# Patient Record
Sex: Female | Born: 1950
Health system: Southern US, Community
[De-identification: ages and names within clinical notes are randomized; demographics above are authoritative.]

## PROBLEM LIST (undated history)

## (undated) DIAGNOSIS — E0500 Thyrotoxicosis with diffuse goiter without thyrotoxic crisis or storm: Secondary | ICD-10-CM

## (undated) HISTORY — DX: Thyrotoxicosis with diffuse goiter without thyrotoxic crisis or storm: E05.00

---

## 2000-12-16 ENCOUNTER — Emergency Department (HOSPITAL_COMMUNITY): Admission: EM | Admit: 2000-12-16 | Discharge: 2000-12-16 | Payer: Self-pay | Admitting: *Deleted

## 2002-10-08 ENCOUNTER — Inpatient Hospital Stay (HOSPITAL_COMMUNITY): Admission: EM | Admit: 2002-10-08 | Discharge: 2002-10-14 | Payer: Self-pay

## 2012-09-07 DIAGNOSIS — E0500 Thyrotoxicosis with diffuse goiter without thyrotoxic crisis or storm: Secondary | ICD-10-CM

## 2012-09-07 HISTORY — DX: Thyrotoxicosis with diffuse goiter without thyrotoxic crisis or storm: E05.00

## 2012-09-12 ENCOUNTER — Ambulatory Visit (INDEPENDENT_AMBULATORY_CARE_PROVIDER_SITE_OTHER): Payer: BC Managed Care – PPO | Admitting: Physician Assistant

## 2012-09-12 VITALS — BP 128/80 | HR 74 | Temp 97.9°F | Resp 20 | Ht 59.0 in | Wt 93.0 lb

## 2012-09-12 DIAGNOSIS — E0590 Thyrotoxicosis, unspecified without thyrotoxic crisis or storm: Secondary | ICD-10-CM

## 2012-09-12 DIAGNOSIS — R748 Abnormal levels of other serum enzymes: Secondary | ICD-10-CM

## 2012-09-12 DIAGNOSIS — E876 Hypokalemia: Secondary | ICD-10-CM

## 2012-09-12 NOTE — Progress Notes (Signed)
  Subjective:    Patient ID: Angela Gutierrez, female    DOB: [birth year], 61 y.o.   MRN: [id number]  HPI This is a pleasant 61 yr old vietnamese female presenting with labwork that was done at her job that revealed hyperthyroidism. The patient had the same labwork done 1 year ago.  It also revealed hyperthyroidism.  At that time she didn't seek treatment, today she is.  She is asymptomatic except that her daughter says she has always been thin.  She denies tachycardia, diarrhea, restlessness. She does have trouble sleeping.  Her daughter is with her interpreting.  Abnormal labs as follows:  Nov. 2012  October 2013  Potassium   3.7   3.1 TSH    0.005   0.005 Alk phos   157   171   Review of Systems  All other systems reviewed and are negative.       Objective:   Physical Exam  Nursing note and vitals reviewed. Constitutional: She is oriented to person, place, and time. She appears well-developed.       Very thin  HENT:  Head: Normocephalic and atraumatic.  Right Ear: External ear normal.  Left Ear: External ear normal.  Neck: Normal range of motion. Neck supple. No thyromegaly present.  Cardiovascular: Normal rate, regular rhythm and normal heart sounds.   Pulmonary/Chest: Effort normal and breath sounds normal.  Neurological: She is alert and oriented to person, place, and time.  Skin: Skin is warm and dry.  Psychiatric: She has a normal mood and affect. Her behavior is normal.        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 Since she is not tachycardic, so there is no reason to begin a beta blocker.  I will refer to endocrinology for management.  Checking a thyroid panel tonight.  I will mail them a copy. The patient also has an elevated  alkaline phosphatase I will also make the endocrinologist aware of for evaluation. Asymptomatic hypokalemia (from October 2013 labs). Check BMET today.

## 2012-09-13 LAB — BASIC METABOLIC PANEL
BUN: 14 mg/dL (ref 6–23)
CO2: 24 mEq/L (ref 19–32)
Calcium: 9.3 mg/dL (ref 8.4–10.5)
Chloride: 106 mEq/L (ref 96–112)
Creat: 0.32 mg/dL — ABNORMAL LOW (ref 0.50–1.10)
Glucose, Bld: 90 mg/dL (ref 70–99)
Potassium: 3.7 mEq/L (ref 3.5–5.3)
Sodium: 140 mEq/L (ref 135–145)

## 2012-09-13 LAB — THYROID PANEL WITH TSH
Free Thyroxine Index: 10.8 — ABNORMAL HIGH (ref 1.0–3.9)
T3 Uptake: 47.9 % — ABNORMAL HIGH (ref 22.5–37.0)
T4, Total: 22.6 ug/dL — ABNORMAL HIGH (ref 5.0–12.5)
TSH: <0.008 u[IU]/mL — ABNORMAL LOW (ref 0.350–4.500)

## 2012-09-16 ENCOUNTER — Encounter: Payer: Self-pay | Admitting: *Deleted

## 2012-10-17 ENCOUNTER — Other Ambulatory Visit (HOSPITAL_COMMUNITY): Payer: Self-pay | Admitting: Endocrinology

## 2012-10-17 DIAGNOSIS — E0590 Thyrotoxicosis, unspecified without thyrotoxic crisis or storm: Secondary | ICD-10-CM

## 2012-11-12 ENCOUNTER — Other Ambulatory Visit (HOSPITAL_COMMUNITY): Payer: Self-pay

## 2012-11-12 ENCOUNTER — Encounter (HOSPITAL_COMMUNITY)
Admission: RE | Admit: 2012-11-12 | Discharge: 2012-11-12 | Disposition: A | Discharge status: Home / Self Care | Payer: BC Managed Care – PPO | Source: Ambulatory Visit | Attending: Endocrinology | Admitting: Endocrinology

## 2012-11-12 ENCOUNTER — Ambulatory Visit (HOSPITAL_COMMUNITY): Payer: Self-pay

## 2012-11-12 DIAGNOSIS — E0590 Thyrotoxicosis, unspecified without thyrotoxic crisis or storm: Secondary | ICD-10-CM | POA: Insufficient documentation

## 2012-11-13 ENCOUNTER — Encounter (HOSPITAL_COMMUNITY)
Admission: RE | Admit: 2012-11-13 | Discharge: 2012-11-13 | Disposition: A | Discharge status: Home / Self Care | Payer: BC Managed Care – PPO | Source: Ambulatory Visit | Attending: Endocrinology | Admitting: Endocrinology

## 2012-11-13 ENCOUNTER — Other Ambulatory Visit (HOSPITAL_COMMUNITY): Payer: Self-pay

## 2012-11-13 MED ORDER — SODIUM PERTECHNETATE TC 99M INJECTION
9.7000 | Freq: Once | INTRAVENOUS | Status: AC | PRN
  Administered 2012-11-13: 10 via INTRAVENOUS

## 2012-11-13 MED ORDER — SODIUM IODIDE I 131 CAPSULE
7.0000 | Freq: Once | INTRAVENOUS | Status: AC | PRN
  Administered 2012-11-13: 7 via ORAL

## 2012-11-23 ENCOUNTER — Ambulatory Visit (INDEPENDENT_AMBULATORY_CARE_PROVIDER_SITE_OTHER): Payer: BC Managed Care – PPO | Admitting: Family Medicine

## 2012-11-23 ENCOUNTER — Ambulatory Visit: Payer: BC Managed Care – PPO

## 2012-11-23 VITALS — BP 156/77 | HR 107 | Temp 98.3°F | Resp 16 | Ht 59.0 in | Wt 93.0 lb

## 2012-11-23 DIAGNOSIS — M25539 Pain in unspecified wrist: Secondary | ICD-10-CM

## 2012-11-23 DIAGNOSIS — S63509A Unspecified sprain of unspecified wrist, initial encounter: Secondary | ICD-10-CM

## 2012-11-23 DIAGNOSIS — M25531 Pain in right wrist: Secondary | ICD-10-CM

## 2012-11-23 DIAGNOSIS — S66911A Strain of unspecified muscle, fascia and tendon at wrist and hand level, right hand, initial encounter: Secondary | ICD-10-CM

## 2012-11-23 NOTE — Progress Notes (Signed)
[address]   [place], [place]  [address]   [phone]  Subjective:    Patient ID: Angela Gutierrez, female    DOB: 1950/12/05, 62 y.o.   MRN: [id number]  HPI This 62 y.o. female presents for evaluation of R wrist pain. Slipped on ice last night at 8:00pm.  Slipped on ice and landed on outstretched hand R.  Now suffering with pain radial aspect of wrist.  +swelling.  No numbness or tingling; only suffering with pain. +icing.  No medication.  Pain kept up last night.  Works with machinery at work; Field seismologist a lot at work.  PCP:  none   Review of Systems  Constitutional: Negative for fever, chills, diaphoresis and fatigue.  Musculoskeletal: Positive for myalgias, joint swelling and arthralgias.  Neurological: Positive for weakness. Negative for numbness.        Past Medical History  Diagnosis Date  . Graves disease 09/2012    History reviewed. No pertinent past surgical history.  Prior to Admission medications   Medication Sig Start Date End Date Taking? Authorizing Provider  potassium bicarbonate (K-LYTE) 25 MEQ disintegrating tablet Take 25 mEq by mouth 2 (two) times daily.   Yes Historical Provider, MD    No Known Allergies  History   Social History  . Marital Status: Married    Spouse Name: N/A    Number of Children: N/A  . Years of Education: N/A   Occupational History  . Machine Software engineer  And  [employer]   Social History Main Topics  . Smoking status: Never Smoker   . Smokeless tobacco: Not on file  . Alcohol Use: No  . Drug Use: No  . Sexually Active: Not on file   Other Topics Concern  . Not on file   Social History Narrative  . No narrative on file    Family History  Problem Relation Age of Onset  . Hypertension Mother   . Cancer Father     Objective:   Physical Exam  Nursing note and vitals reviewed. Constitutional: She appears well-developed and well-nourished. No distress.  Cardiovascular: Intact distal pulses.   Pulses:  Radial pulses are 2+ on the right side.  Capillary refill < 3 seconds R hand.  Musculoskeletal:       Right elbow: Normal.She exhibits normal range of motion, no swelling and no effusion. No tenderness found. No radial head, no medial epicondyle, no lateral epicondyle and no olecranon process tenderness noted.       Right wrist: She exhibits swelling. She exhibits normal range of motion, no tenderness and no bony tenderness.       Right hand: She exhibits decreased range of motion, tenderness, bony tenderness and swelling. She exhibits normal two-point discrimination, normal capillary refill, no deformity and no laceration. Normal sensation noted. Decreased sensation is not present in the ulnar distribution, is not present in the medial distribution and is not present in the radial distribution. Normal strength noted.  R FOREARM:  FULL EXTENSION R ELBOW; FULL ROM R WRIST WITH MILD PAIN PROXIMAL HAND; +SWELLING PROXIMAL HAND; +TTP SNUFFBOX; FULL ROM THUMB R WITHOUT PAIN OR LIMITATION.  GRIP 5/5.  NORMAL PRONATION/SUPINATION RUE.  Skin: She is not diaphoretic.    [facility] reading (PRIMARY) by  Dr. Katrinka Blazing.    R WRIST: NAD; R HAND: NAD.      Assessment & Plan:  Pain in right wrist - Plan: DG Wrist Complete Right, DG Hand Complete Right    1. Pain R  hand/wrist:  New.  After fall on ice.  +snuffbox tenderness. Treat conservatively with thumb spica splint, icing bid for next 5 days.  No use of R hand for next week at work; work note provided. 2.  Strain R hand/wrist:  New.  Thumb spica splint provided to wear until next visit in one week.  Ice bid for next five days.  RTC in one week for reevaluation.

## 2012-11-23 NOTE — Patient Instructions (Addendum)
Pain in right wrist - Plan: DG Wrist Complete Right, DG Hand Complete Right  Strain of wrist, right, initial encounter    WEAR SPLINT DAILY AND AT NIGHT FOR NEXT WEEK. ICE WRIST TWICE DAILY FOR NEXT FIVE DAYS. RETURN IN ONE WEEK FOR REEVALUATION.

## 2012-11-30 ENCOUNTER — Ambulatory Visit (INDEPENDENT_AMBULATORY_CARE_PROVIDER_SITE_OTHER): Payer: BC Managed Care – PPO | Admitting: Family Medicine

## 2012-11-30 VITALS — BP 111/63 | HR 77 | Temp 97.8°F | Resp 16 | Ht 60.0 in | Wt 93.2 lb

## 2012-11-30 DIAGNOSIS — S63501D Unspecified sprain of right wrist, subsequent encounter: Secondary | ICD-10-CM

## 2012-11-30 NOTE — Patient Instructions (Signed)
Continue the splint for protection for 1 more week, and then recheck.  If the scaphoid bone is still sore - we will recheck an x ray.  See note for work.  Return to the clinic or go to the nearest emergency room if any of your symptoms worsen or new symptoms occur.

## 2012-11-30 NOTE — Addendum Note (Signed)
Addended by: Franciso Bend on: 11/30/2012 10:44 AM   Modules accepted: Orders

## 2012-11-30 NOTE — Progress Notes (Signed)
  Subjective:    Patient ID: Angela Gutierrez, female    DOB: 08/29/1951, 62 y.o.   MRN: [id number]  HPI Angela ACASIA SKILTON is a 62 y.o. female S/p fall on R wrist 2/115/14.  See 11/23/12 ov with Dr. Katrinka Blazing.  Placed into thumb spica splint and here for follow up. xray reports reviewed of R  Hand and wrist - no acute bony abnormality.   Here with dtr- translating.   Pain better everyday. Naproxen - up to 4 times per day.  Work - makes Engineering geologist. R hand dominant.    Review of Systems No rashes. Skin intact, no additional areas of pain - no elbow/shoulder pain.     Objective:   Physical Exam  Vitals reviewed. Constitutional: She is oriented to person, place, and time. She appears well-developed and well-nourished. No distress.  Pulmonary/Chest: Effort normal.  Musculoskeletal:       Right wrist: She exhibits bony tenderness (slight ttp at scaphoid only, no distal or proximal radial ttp, no other ttp on wrist/hand.  skin intact. ). She exhibits normal range of motion, no swelling, no effusion, no crepitus, no deformity and no laceration.  Neurological: She is alert and oriented to person, place, and time.  nvi distally.   Skin: Skin is warm and dry.  Psychiatric: She has a normal mood and affect. Her behavior is normal.      Assessment & Plan:  Angela Gutierrez is a 62 y.o. female Wrist sprain, right, subsequent encounter  Pain, wrist joint, right  Still ttp at scaphoid - continue brace for 1 more week, then recheck xray. Naprosyn only as needed.  rtc precautions.  Patient Instructions  Continue the splint for protection for 1 more week, and then recheck.  If the scaphoid bone is still sore - we will recheck an x ray.  See note for work.  Return to the clinic or go to the nearest emergency room if any of your symptoms worsen or new symptoms occur.

## 2012-12-01 ENCOUNTER — Other Ambulatory Visit (HOSPITAL_COMMUNITY): Payer: Self-pay | Admitting: Endocrinology

## 2012-12-01 DIAGNOSIS — E0590 Thyrotoxicosis, unspecified without thyrotoxic crisis or storm: Secondary | ICD-10-CM

## 2012-12-04 ENCOUNTER — Ambulatory Visit (HOSPITAL_COMMUNITY)
Admission: RE | Admit: 2012-12-04 | Discharge: 2012-12-04 | Disposition: A | Discharge status: Home / Self Care | Payer: BC Managed Care – PPO | Source: Ambulatory Visit | Attending: Endocrinology | Admitting: Endocrinology

## 2012-12-04 ENCOUNTER — Ambulatory Visit (INDEPENDENT_AMBULATORY_CARE_PROVIDER_SITE_OTHER): Payer: BC Managed Care – PPO | Admitting: Family Medicine

## 2012-12-04 ENCOUNTER — Ambulatory Visit: Payer: BC Managed Care – PPO

## 2012-12-04 VITALS — BP 122/66 | HR 68 | Temp 97.9°F | Resp 12 | Ht 60.0 in | Wt 92.8 lb

## 2012-12-04 DIAGNOSIS — S63501D Unspecified sprain of right wrist, subsequent encounter: Secondary | ICD-10-CM

## 2012-12-04 DIAGNOSIS — E0590 Thyrotoxicosis, unspecified without thyrotoxic crisis or storm: Secondary | ICD-10-CM | POA: Insufficient documentation

## 2012-12-04 NOTE — Progress Notes (Signed)
  Subjective:    Patient ID: Angela Gutierrez, female    DOB: Feb 07, 1951, 62 y.o.   MRN: [id number]  HPI Angela Gutierrez is a 62 y.o. female S/p fall 11/22/12 - see initial ov 11/23/12 with Dr. Katrinka Blazing, and my recent eval.  Still ttp at scaphoid last ov - continued thumb spica splint.  Here for recheck.   Feels better - wearing brace.   No pain meds needed for this.  Swelling resolved.  Little soreness yesterday - no pain today.   Review of Systems As above, no bruising, swelling resolved.     Objective:   Physical Exam  Vitals reviewed. Constitutional: She is oriented to person, place, and time.  Pulmonary/Chest: Effort normal.  Musculoskeletal:       Right wrist: She exhibits normal range of motion.       Arms: Neurological: She is alert and oriented to person, place, and time.  Skin: Skin is warm and dry. No rash noted. No erythema.  Psychiatric: She has a normal mood and affect. Her behavior is normal.   [facility] reading (PRIMARY) by  Dr. Neva Seat:  R wrist: questionable irregularity at distal 1/3rd scaphoid on scaphoid view only.        Assessment & Plan:  Angela Gutierrez is a 62 y.o. female Wrist sprain, right, subsequent encounter - Plan: DG Wrist Complete Right  Wrist sprain - symptomatically improved. No discrete fx on xray, only min ttp with deep palpation, but no pain with rom/use. Will keep in thumb spica splint until overread and if negative, consider trial of out of splint starting next week.   rtc precautions, and dtr here interpreting.   Patient Instructions  Continue the splint until the radiologist reads the xray, but if normal/negative - can try out of splint next week, then recheck if any recurrence of pain.

## 2012-12-04 NOTE — Patient Instructions (Addendum)
Continue the splint until the radiologist reads the xray, but if normal/negative - can try out of splint next week, then recheck if any recurrence of pain.

## 2012-12-05 ENCOUNTER — Telehealth: Payer: Self-pay

## 2012-12-05 NOTE — Telephone Encounter (Signed)
Patients daughter advised and she is provided your schedule.

## 2012-12-05 NOTE — Telephone Encounter (Signed)
ANH WOULD LIKE TO SPEAK WITH SOMEONE REGARDING HER MOM'S XRAYS. STATES DR Neva Seat WANTED HER TO COME BACK IN A WEEK, BUT SHE HAVE A FRACTURE SO SHE DIDN'T KNOW IF HER MOM SHOULD COME BACK OR SHOULD WE REFER HER. PLEASE CALL AFTER 12:30 TO [phone]

## 2012-12-05 NOTE — Telephone Encounter (Signed)
Notes Recorded by Levon Hedger on 12/05/2012 at 9:12 AM SPOKE WITH PT'S DAUGHTER AND GAVE HER MESSAGE. ------  Notes Recorded by Shade Flood, MD on 12/04/2012 at 4:06 PM Call patient/daughter. Radiologist did also see the questionable area of fracture on the scaphoid bone - but only on the one view. Due to this, I would recommend continuing the brace for another week then recheck. If her work will let her work with the brace on, she can return, but if not, we can give her a note for work.  Called left message for call back.

## 2012-12-14 ENCOUNTER — Ambulatory Visit (INDEPENDENT_AMBULATORY_CARE_PROVIDER_SITE_OTHER): Payer: BC Managed Care – PPO | Admitting: Family Medicine

## 2012-12-14 ENCOUNTER — Ambulatory Visit: Payer: BC Managed Care – PPO

## 2012-12-14 VITALS — BP 106/60 | HR 61 | Temp 97.7°F | Resp 16 | Ht 60.25 in | Wt 91.2 lb

## 2012-12-14 NOTE — Progress Notes (Signed)
  Subjective:    Patient ID: Angela Gutierrez, female    DOB: Feb 12, 1951, 62 y.o.   MRN: [id number]  HPI Angela Gutierrez is a 62 y.o. female  S/p fall 11/22/12 - see prior notes.  Placed in thumb spica splint for possible scaphoid fx. Last ov 12/04/12, much improved, pain free rom, only minimal soreness with deep palpation of scaphoid.  xr report: Findings: Joint spaces appear intact. Dedicated scaphoid view does  demonstrate lucency involving the distal third of the scaphoid  which may simply represent normal trabeculation, but nondisplaced  fracture cannot be excluded. No other abnormality is noted.  IMPRESSION:  Linear lucency involving the distal scaphoid seen on scaphoid view  only which may represent normal trabeculation, but nondisplaced  fracture cannot be excluded. No other abnormality seen.   Sore in base of thumb - past few days, no new injury.  Slight ache - feels better when wearing brace.   Radiation for hyperthyroid  About 10 days ago.   Review of Systems No rash, no new injury.      Objective:   Physical Exam  Vitals reviewed. Constitutional: She is oriented to person, place, and time. She appears well-developed and well-nourished. No distress.  Pulmonary/Chest: Effort normal.  Musculoskeletal:       Right wrist: She exhibits decreased range of motion, tenderness and bony tenderness (not tender at scaphoid, but slight ttp at 1st [facility] joint with slight sts.  no redness/wound. ).  Neurological: She is alert and oriented to person, place, and time.  nvi distally.   Skin: Skin is warm and dry.  Psychiatric: She has a normal mood and affect. Her behavior is normal.    [facility] reading (PRIMARY) by  Dr. Neva Seat: R wrist with scaphoid views.  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 .     Assessment & Plan:  Angela Gutierrez is a 62 y.o. female Right wrist pain - Plan: DG Wrist Complete Right, AMB referral to orthopedics  Wrist strain, right, subsequent encounter - Plan: AMB referral  to orthopedics  suspected initialwrist sprain 11/22/12, doubt scaphoid fx as nt, but questionable area on XR and for this reason has remained in thumb spica splint. 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  Will send xr to overread again, but plan on ortho eval this week. Brace until seen. But can apply heat or ice, tylenol prn.  rtc precautions.   Patient Instructions  Continue brace until report received from radiologist.  Heat or ice to area as needed. We will call you wit the xray report, but will plan on evaluation this week with an orthopaedic doctor.  Return to the clinic or go to the nearest emergency room if any of your symptoms worsen or new symptoms occur.

## 2012-12-14 NOTE — Patient Instructions (Signed)
Continue brace until report received from radiologist.  Heat or ice to area as needed. We will call you wit the xray report, but will plan on evaluation this week with an orthopaedic doctor.  Return to the clinic or go to the nearest emergency room if any of your symptoms worsen or new symptoms occur.

## 2012-12-15 ENCOUNTER — Telehealth: Payer: Self-pay

## 2012-12-15 NOTE — Telephone Encounter (Signed)
Pt daughter is calling back because someone called her mother today and her mother had came in on Sunday to be seen and Dr. Neva Seat seen the mother she is wondering what the call is about.

## 2012-12-16 NOTE — Telephone Encounter (Signed)
Spoke with daughter Carvel Getting about xray results

## 2013-05-20 ENCOUNTER — Encounter: Payer: Self-pay | Admitting: Family Medicine

## 2014-01-31 IMAGING — CR DG WRIST COMPLETE 3+V*R*
2 series · 2 of 2 positions shown · non-contrast
Comparison: None

CLINICAL DATA: Fall on ice.  Wrist pain.

RIGHT WRIST - COMPLETE 3+ VIEW

[PA]
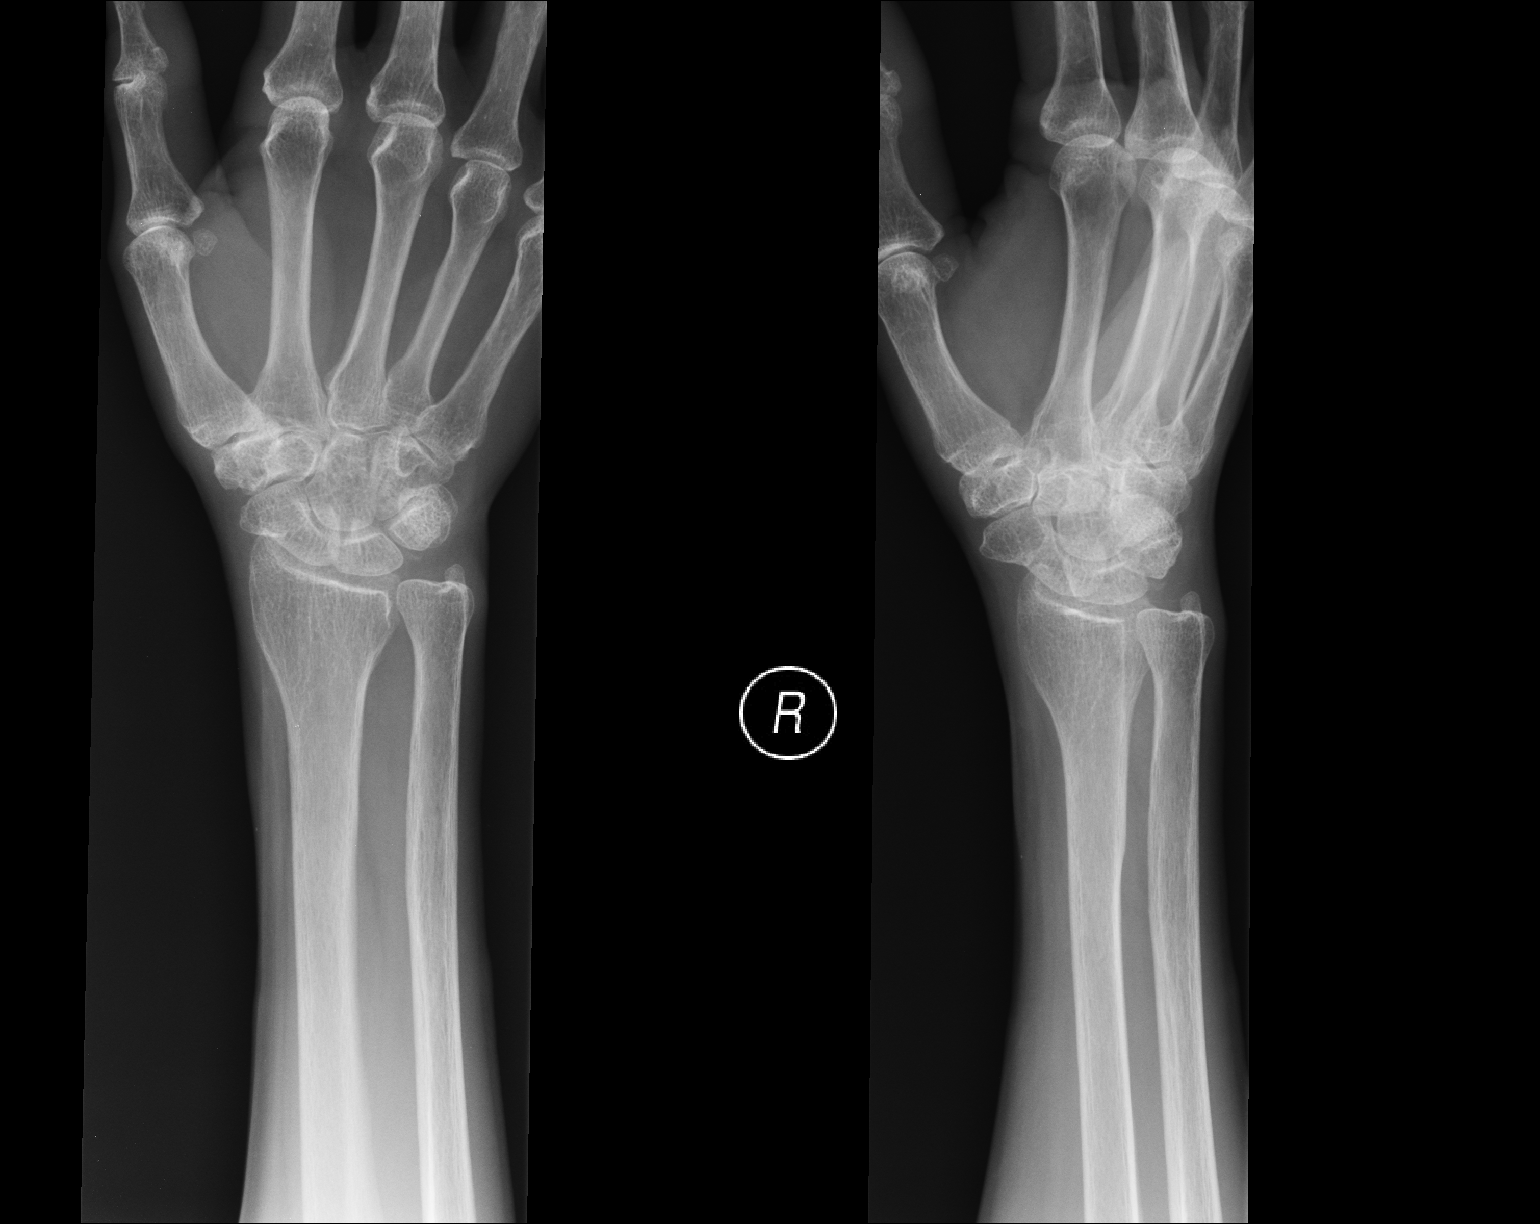

[lateral]
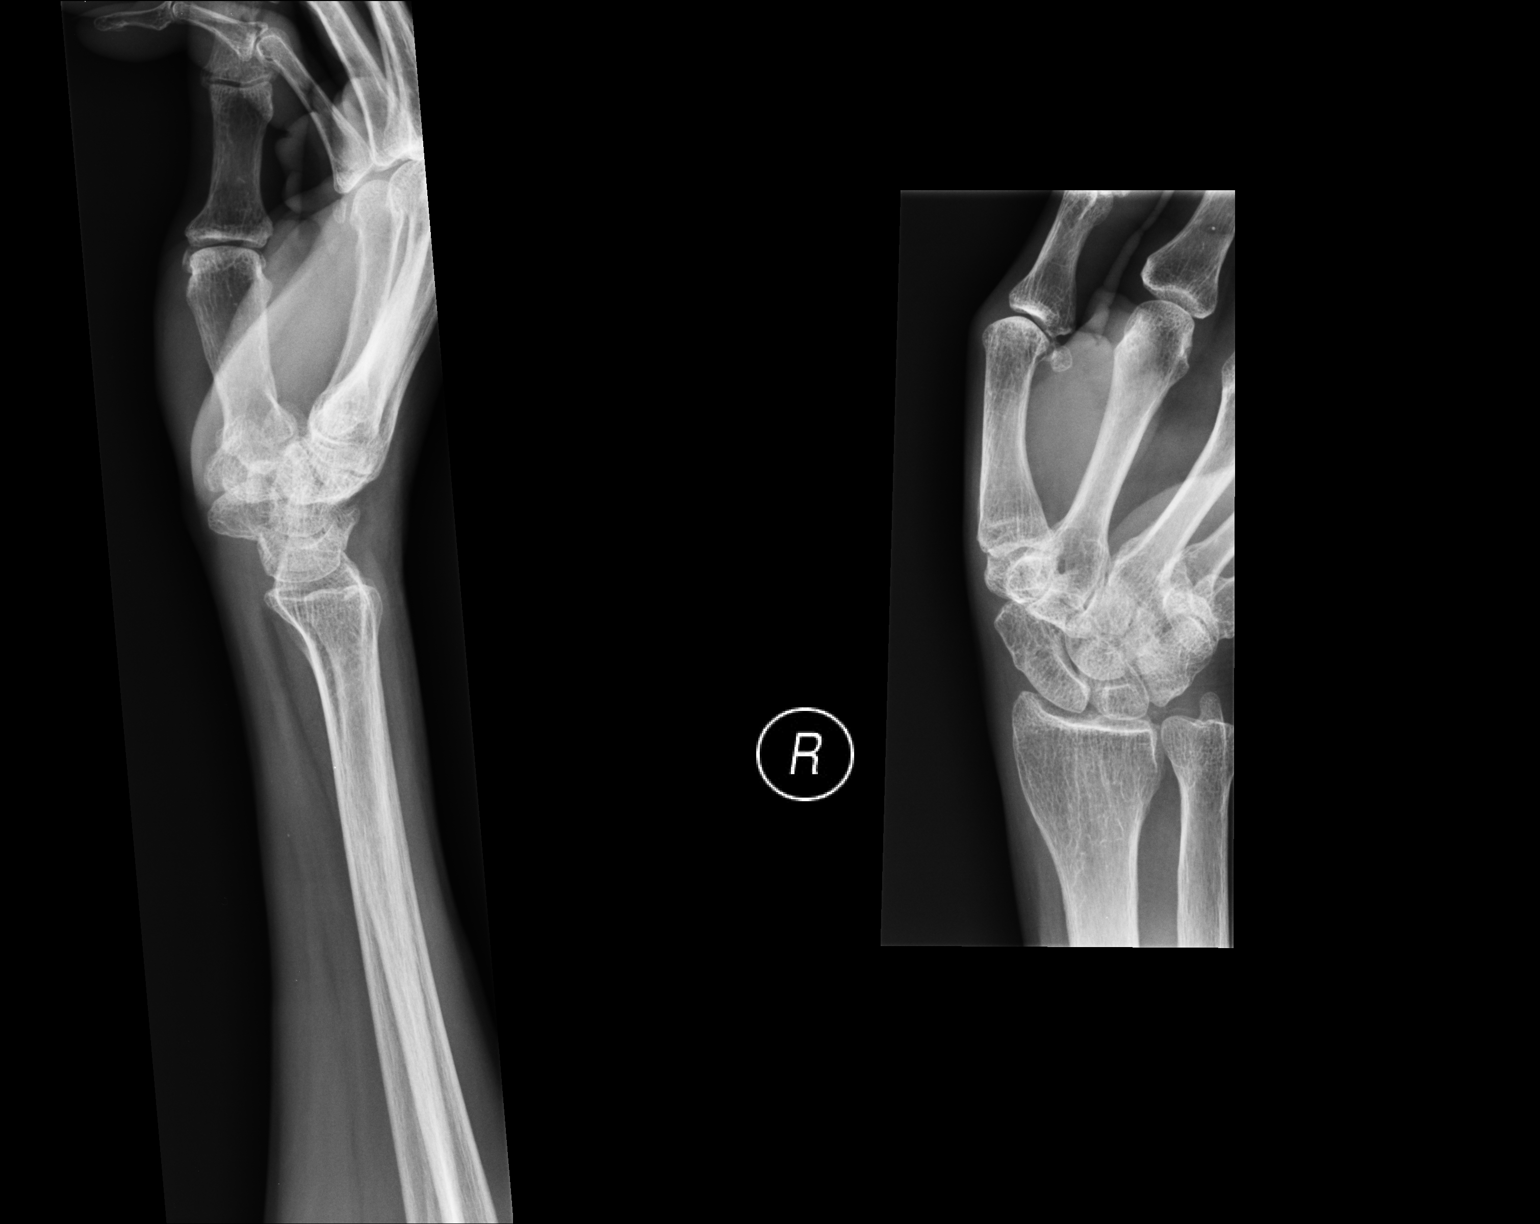

[2 of 2 positions shown; findings below may reference images not displayed]

FINDINGS: No acute bony abnormality.  Specifically, no fracture,
subluxation, or dislocation.  Soft tissues are intact.  Wrist
joints are maintained.
IMPRESSION: No acute bony abnormality.

## 2015-10-03 ENCOUNTER — Ambulatory Visit (INDEPENDENT_AMBULATORY_CARE_PROVIDER_SITE_OTHER): Payer: BLUE CROSS/BLUE SHIELD | Admitting: Family Medicine

## 2015-10-03 VITALS — BP 138/80 | HR 108 | Temp 98.2°F | Resp 20 | Ht 59.0 in | Wt 107.4 lb

## 2015-10-03 DIAGNOSIS — W540XXA Bitten by dog, initial encounter: Secondary | ICD-10-CM | POA: Diagnosis not present

## 2015-10-03 DIAGNOSIS — M79601 Pain in right arm: Secondary | ICD-10-CM | POA: Diagnosis not present

## 2015-10-03 DIAGNOSIS — M79604 Pain in right leg: Secondary | ICD-10-CM

## 2015-10-03 DIAGNOSIS — S51811A Laceration without foreign body of right forearm, initial encounter: Secondary | ICD-10-CM | POA: Diagnosis not present

## 2015-10-03 DIAGNOSIS — Z23 Encounter for immunization: Secondary | ICD-10-CM | POA: Diagnosis not present

## 2015-10-03 MED ORDER — AMOXICILLIN-POT CLAVULANATE 500-125 MG PO TABS: 1.0000 | ORAL_TABLET | Freq: Two times a day (BID) | ORAL | Status: DC

## 2015-10-03 NOTE — Progress Notes (Signed)
Procedure: Verbal consent obtained. Skin was anesthetized with 3 cc 1% lido with epi and cleaned with soap and water. Lacerations located right forearm was sutured with #5 5.0 ethilon sutures. Wound was dressed and wound care discussed.

## 2015-10-03 NOTE — Progress Notes (Signed)
 @  By signing my name below, I, Raven Small, attest that this documentation has been prepared under the direction and in the presence of Elvina Sidle, MD.  Electronically Signed: Andrew Au, ED Scribe. 10/03/2015. 7:24 PM.  Patient ID: Angela Gutierrez MRN: [id number], DOB: 07-28-1951, 64 y.o. Date of Encounter: 10/03/2015, 7:21 PM  Primary Physician: Michiel Sites, MD  Chief Complaint:  Chief Complaint  Patient presents with   Animal Bite    dog, today    HPI: 64 y.o. year old female with history below presents with a dog bite to right wrist that occurred 20 minutes ago. Pt was bit by her daughters small dog, after trying to pick it up. She now had multiple lacerations to right wrist. The wounds were cleaned with alcohol and cover with a band aid. The dog is UTD on immunization.     Past Medical History  Diagnosis Date   Graves disease 09/2012     Home Meds: Prior to Admission medications   Medication Sig Start Date End Date Taking? Authorizing Provider  Cholecalciferol (VITAMIN D3 PO) Take 10,000 Units by mouth See admin instructions. Twice week , Tuesday and Thurday   Yes Historical Provider, MD  levothyroxine (SYNTHROID, LEVOTHROID) 75 MCG tablet Take 75 mcg by mouth daily before breakfast.   Yes Historical Provider, MD    Allergies: No Known Allergies  Social History   Social History   Marital Status: Married    Spouse Name: N/A   Number of Children: N/A   Years of Education: N/A   Occupational History   International aid/development worker  And  [employer]   Social History Main Topics   Smoking status: Never Smoker    Smokeless tobacco: Not on file   Alcohol Use: No   Drug Use: No   Sexual Activity: Not on file   Other Topics Concern   Not on file   Social History Narrative     Review of Systems: Constitutional: negative for chills, fever, night sweats, weight changes, or fatigue  HEENT: negative for vision changes, hearing loss,  congestion, rhinorrhea, ST, epistaxis, or sinus pressure Cardiovascular: negative for chest pain or palpitations Respiratory: negative for hemoptysis, wheezing, shortness of breath, or cough Abdominal: negative for abdominal pain, nausea, vomiting, diarrhea, or constipation Dermatological: negative for rash. Positive wounds Neurologic: negative for headache, dizziness, or syncope All other systems reviewed and are otherwise negative with the exception to those above and in the HPI.   Physical Exam: Blood pressure 138/80, pulse 108, temperature 98.2 F (36.8 C), temperature source Oral, resp. rate 20, height  (1.499 m), weight 107 lb 6.4 oz (48.716 kg), SpO2 99 %., Body mass index is 21.68 kg/(m^2). General: Well developed, well nourished, in no acute distress. Head: Normocephalic, atraumatic, eyes without discharge, sclera non-icteric, nares are without discharge. Bilateral auditory canals clear, TM's are without perforation, pearly grey and translucent with reflective cone of light bilaterally. Oral cavity moist, posterior pharynx without exudate, erythema, peritonsillar abscess, or post nasal drip.  Neck: Supple. No thyromegaly. Full ROM. No lymphadenopathy. Msk:  Strength and tone normal for age. Extremities/Skin: Warm and dry. Multiple 3-4 mm lacerations over the ulna of the right forearm distally, 1.5 cm laceration over the right ulna with jagged edges Neuro: Alert and oriented X 3. Moves all extremities spontaneously. Gait is normal. CNII-XII grossly in tact. Psych:  Responds to questions appropriately with a normal affect.    ASSESSMENT AND PLAN:  64 y.o. year old female with  dog bite one hour prior to arrival This chart was scribed in my presence and reviewed by me personally.    ICD-9-CM ICD-10-CM   1. Dog bite 879.8 T14.8 Tdap vaccine greater than or equal to 7yo IM   E906.0 W54.0XXA   2. Arm pain, medial, right 729.5 M79.601     M79.604       Signed, Elvina SidleKurt  Lauenstein, MD 10/03/2015 7:21 PM

## 2015-10-03 NOTE — Patient Instructions (Addendum)
WOUND CARE Please return in 7 days to have your stitches/staples removed or sooner if you have concerns. Marland Kitchen Keep area clean and dry with dressing in place for 24 hours. . After 24 hours, remove bandage and wash wound gently with mild soap and warm water. When skin is dry, reapply a new bandage. Once wound is no longer draining, may leave wound open to air. . Continue daily cleansing with soap and water until stitches/staples are removed. . Do not apply any ointments or creams to the wound while stitches/staples are in place, as this may cause delayed healing. . Notify the office if you experience any of the following signs of infection: Swelling, redness, pus drainage, streaking, fever >101.0 F . Notify the office if you experience excessive bleeding that does not stop after 15-20 minutes of constant, firm Pressure.  Take antibiotic twice a day until finished.    Tdap Vaccine (Tetanus, Diphtheria and Pertussis): What You Need to Know 1. Why get vaccinated? Tetanus, diphtheria and pertussis are very serious diseases. Tdap vaccine can protect [place] from these diseases. And, Tdap vaccine given to pregnant women can protect newborn babies against pertussis. TETANUS (Lockjaw) is rare in the [place] today. It causes painful muscle tightening and stiffness, usually all over the body.  It can lead to tightening of muscles in the head and neck so you can't open your mouth, swallow, or sometimes even breathe. Tetanus kills about 1 out of 10 people who are infected even after receiving the best medical care. DIPHTHERIA is also rare in the [place] today. It can cause a thick coating to form in the back of the throat.  It can lead to breathing problems, heart failure, paralysis, and death. PERTUSSIS (Whooping Cough) causes severe coughing spells, which can cause difficulty breathing, vomiting and disturbed sleep.  It can also lead to weight loss, incontinence, and rib fractures. Up to 2  in 100 adolescents and 5 in 100 adults with pertussis are hospitalized or have complications, which could include pneumonia or death. These diseases are caused by bacteria. Diphtheria and pertussis are spread from person to person through secretions from coughing or sneezing. Tetanus enters the body through cuts, scratches, or wounds. Before vaccines, as many as 200,000 cases of diphtheria, 200,000 cases of pertussis, and hundreds of cases of tetanus, were reported in the [place] each year. Since vaccination began, reports of cases for tetanus and diphtheria have dropped by about 99% and for pertussis by about 80%. 2. Tdap vaccine Tdap vaccine can protect adolescents and adults from tetanus, diphtheria, and pertussis. One dose of Tdap is routinely given at age 33 or 9. People who did not get Tdap at that age should get it as soon as possible. Tdap is especially important for healthcare professionals and anyone having close contact with a baby younger than 12 months. Pregnant women should get a dose of Tdap during every pregnancy, to protect the newborn from pertussis. Infants are most at risk for severe, life-threatening complications from pertussis. Another vaccine, called Td, protects against tetanus and diphtheria, but not pertussis. A Td booster should be given every 10 years. Tdap may be given as one of these boosters if you have never gotten Tdap before. Tdap may also be given after a severe cut or burn to prevent tetanus infection. Your doctor or the person giving you the vaccine can give you more information. Tdap may safely be given at the same time as other vaccines. 3. Some people should not  get this vaccine  A person who has ever had a life-threatening allergic reaction after a previous dose of any diphtheria, tetanus or pertussis containing vaccine, OR has a severe allergy to any part of this vaccine, should not get Tdap vaccine. Tell the person giving the vaccine about any severe  allergies.  Anyone who had coma or long repeated seizures within 7 days after a childhood dose of DTP or DTaP, or a previous dose of Tdap, should not get Tdap, unless a cause other than the vaccine was found. They can still get Td.  Talk to your doctor if you:  have seizures or another nervous system problem,  had severe pain or swelling after any vaccine containing diphtheria, tetanus or pertussis,  ever had a condition called Guillain-Barr Syndrome (GBS),  aren't feeling well on the day the shot is scheduled. 4. Risks With any medicine, including vaccines, there is a chance of side effects. These are usually mild and go away on their own. Serious reactions are also possible but are rare. Most people who get Tdap vaccine do not have any problems with it. Mild problems following Tdap (Did not interfere with activities)  Pain where the shot was given (about 3 in 4 adolescents or 2 in 3 adults)  Redness or swelling where the shot was given (about 1 person in 5)  Mild fever of at least 100.48F (up to about 1 in 25 adolescents or 1 in 100 adults)  Headache (about 3 or 4 people in 10)  Tiredness (about 1 person in 3 or 4)  Nausea, vomiting, diarrhea, stomach ache (up to 1 in 4 adolescents or 1 in 10 adults)  Chills, sore joints (about 1 person in 10)  Body aches (about 1 person in 3 or 4)  Rash, swollen glands (uncommon) Moderate problems following Tdap (Interfered with activities, but did not require medical attention)  Pain where the shot was given (up to 1 in 5 or 6)  Redness or swelling where the shot was given (up to about 1 in 16 adolescents or 1 in 12 adults)  Fever over 102F (about 1 in 100 adolescents or 1 in 250 adults)  Headache (about 1 in 7 adolescents or 1 in 10 adults)  Nausea, vomiting, diarrhea, stomach ache (up to 1 or 3 people in 100)  Swelling of the entire arm where the shot was given (up to about 1 in 500). Severe problems following Tdap (Unable  to perform usual activities; required medical attention)  Swelling, severe pain, bleeding and redness in the arm where the shot was given (rare). Problems that could happen after any vaccine:  People sometimes faint after a medical procedure, including vaccination. Sitting or lying down for about 15 minutes can help prevent fainting, and injuries caused by a fall. Tell your doctor if you feel dizzy, or have vision changes or ringing in the ears.  Some people get severe pain in the shoulder and have difficulty moving the arm where a shot was given. This happens very rarely.  Any medication can cause a severe allergic reaction. Such reactions from a vaccine are very rare, estimated at fewer than 1 in a million doses, and would happen within a few minutes to a few hours after the vaccination. As with any medicine, there is a very remote chance of a vaccine causing a serious injury or death. The safety of vaccines is always being monitored. For more information, visit: http://floyd.org/www.cdc.gov/vaccinesafety/ 5. What if there is a serious problem? What should  I look for?  Look for anything that concerns you, such as signs of a severe allergic reaction, very high fever, or unusual behavior.  Signs of a severe allergic reaction can include hives, swelling of the face and throat, difficulty breathing, a fast heartbeat, dizziness, and weakness. These would usually start a few minutes to a few hours after the vaccination. What should I do?  If you think it is a severe allergic reaction or other emergency that can't wait, call 9-1-1 or get the person to the nearest hospital. Otherwise, call your doctor.  Afterward, the reaction should be reported to the Vaccine Adverse Event Reporting System (VAERS). Your doctor might file this report, or you can do it yourself through the VAERS web site at www.vaers.LAgents.no, or by calling 1-402 294 4337. VAERS does not give medical advice.  6. The National Vaccine Injury  Compensation Program The [employer] Vaccine Injury Compensation Program (VICP) is a federal program that was created to compensate people who may have been injured by certain vaccines. Persons who believe they may have been injured by a vaccine can learn about the program and about filing a claim by calling 1-347-415-4613 or visiting the VICP website at SpiritualWord.at. There is a time limit to file a claim for compensation. 7. How can I learn more?  Ask your doctor. He or she can give you the vaccine package insert or suggest other sources of information.  Call your local or state health department.  Contact the Centers for Disease Control and Prevention (CDC):  Call (709)574-7212 (1-800-CDC-INFO) or  Visit CDC's website at PicCapture.uy CDC Tdap Vaccine VIS (12/01/13)   This information is not intended to replace advice given to you by your health care provider. Make sure you discuss any questions you have with your health care provider.   Document Released: 03/25/2012 Document Revised: 10/15/2014 Document Reviewed: 01/06/2014 Elsevier Interactive Patient Education [employer]

## 2015-10-10 ENCOUNTER — Encounter: Payer: Self-pay | Admitting: Physician Assistant

## 2015-10-10 ENCOUNTER — Ambulatory Visit (INDEPENDENT_AMBULATORY_CARE_PROVIDER_SITE_OTHER): Payer: BLUE CROSS/BLUE SHIELD | Admitting: Physician Assistant

## 2015-10-10 VITALS — BP 140/70 | HR 92 | Temp 98.2°F | Resp 17 | Ht 59.0 in | Wt 112.0 lb

## 2015-10-10 DIAGNOSIS — Z5189 Encounter for other specified aftercare: Secondary | ICD-10-CM

## 2015-10-10 NOTE — Progress Notes (Signed)
   10/10/2015 6:14 PM   DOB: [birth year] / MRN: [id number]  SUBJECTIVE:  Ms. Angela Gutierrez is a well appearing 65 y.o. here today for wound care. She denies exquisite tenderness at the site of the wound, nausea, emesis, fever and chills.  She has been compliant with medical therapy and recommendations thus far.   She has No Known Allergies.   She  has a past medical history of Graves disease (09/2012).    She  reports that she has never smoked. She does not have any smokeless tobacco history on file. She reports that she does not drink alcohol or use illicit drugs. She  has no sexual activity history on file. The patient  has no past surgical history on file.  Her family history includes Cancer in her father; Hypertension in her mother.  Review of Systems  Constitutional: Negative for fever.  Gastrointestinal: Negative for vomiting.  Skin: Negative for itching and rash.  Neurological: Negative for dizziness.    Problem list and medications reviewed and updated by myself where necessary, and exist elsewhere in the encounter.   OBJECTIVE:  BP 140/70 mmHg  Pulse 92  Temp(Src) 98.2 F (36.8 C) (Oral)  Resp 17  Ht 4\' 11"  (1.499 m)  Wt 112 lb (50.803 kg)  BMI 22.61 kg/m2  SpO2 97% CrCl cannot be calculated (Patient has no serum creatinine result on file.).  Physical Exam  Constitutional: She is oriented to person, place, and time. She appears well-nourished. No distress.  Eyes: EOM are normal. Pupils are equal, round, and reactive to light.  Cardiovascular: Normal rate.   Pulmonary/Chest: Effort normal.  Abdominal: She exhibits no distension.  Neurological: She is alert and oriented to person, place, and time. No cranial nerve deficit. Gait normal.  Skin: Skin is dry. She is not diaphoretic.     Psychiatric: She has a normal mood and affect.  Vitals reviewed.   No results found for this or any previous visit (from the past 48 hour(s)).  ASSESSMENT AND PLAN  [name] was seen today  for suture / staple removal.  Diagnoses and all orders for this visit:  Encounter for wound care: Wound has progressed well.  She has taken Amox-clav without side effects.    The patient was advised to call or return to clinic if she does not see an improvement in symptoms or to seek the care of the closest emergency department if she worsens with the above plan.   Deliah BostonMichael Clark, MHS, PA-C [facility] 10/10/2015 6:14 PM

## 2015-10-11 NOTE — Progress Notes (Signed)
  Medical screening examination/treatment/procedure(s) were performed by non-physician practitioner and as supervising physician I was immediately available for consultation/collaboration.     

## 2017-05-20 ENCOUNTER — Other Ambulatory Visit: Payer: Self-pay | Admitting: Pharmacist Clinician (PhC)/ Clinical Pharmacy Specialist

## 2017-05-20 DIAGNOSIS — Z23 Encounter for immunization: Secondary | ICD-10-CM

## 2017-05-21 ENCOUNTER — Other Ambulatory Visit: Payer: Self-pay | Admitting: Pharmacist Clinician (PhC)/ Clinical Pharmacy Specialist

## 2017-05-21 MED ORDER — ZOSTER VAC RECOMB ADJUVANTED 50 MCG/0.5ML IM SUSR: 0.5000 mL | INTRAMUSCULAR | 1 refills | Status: AC

## 2017-05-21 MED FILL — SHINGRIX 50 MCG SUS: 50 | 56 days supply | Qty: 1 | Fill #0

## 2017-07-29 MED FILL — SHINGRIX VIAL KIT: 50 | 56 days supply | Qty: 1 | Fill #1

## 2017-11-11 DIAGNOSIS — E890 Postprocedural hypothyroidism: Secondary | ICD-10-CM | POA: Diagnosis not present

## 2017-11-11 DIAGNOSIS — R5383 Other fatigue: Secondary | ICD-10-CM | POA: Diagnosis not present

## 2017-11-11 DIAGNOSIS — E039 Hypothyroidism, unspecified: Secondary | ICD-10-CM | POA: Diagnosis not present

## 2017-11-12 DIAGNOSIS — N390 Urinary tract infection, site not specified: Secondary | ICD-10-CM | POA: Diagnosis not present

## 2017-11-12 DIAGNOSIS — R319 Hematuria, unspecified: Secondary | ICD-10-CM | POA: Diagnosis not present

## 2017-11-18 DIAGNOSIS — R5383 Other fatigue: Secondary | ICD-10-CM | POA: Diagnosis not present

## 2017-11-18 DIAGNOSIS — Z0000 Encounter for general adult medical examination without abnormal findings: Secondary | ICD-10-CM | POA: Diagnosis not present

## 2017-11-18 DIAGNOSIS — E890 Postprocedural hypothyroidism: Secondary | ICD-10-CM | POA: Diagnosis not present

## 2017-11-18 DIAGNOSIS — Z23 Encounter for immunization: Secondary | ICD-10-CM | POA: Diagnosis not present

## 2017-11-18 DIAGNOSIS — R319 Hematuria, unspecified: Secondary | ICD-10-CM | POA: Diagnosis not present

## 2017-12-16 DIAGNOSIS — R3121 Asymptomatic microscopic hematuria: Secondary | ICD-10-CM | POA: Diagnosis not present

## 2018-01-02 DIAGNOSIS — N200 Calculus of kidney: Secondary | ICD-10-CM | POA: Diagnosis not present

## 2018-01-02 DIAGNOSIS — R3121 Asymptomatic microscopic hematuria: Secondary | ICD-10-CM | POA: Diagnosis not present

## 2018-01-20 DIAGNOSIS — N200 Calculus of kidney: Secondary | ICD-10-CM | POA: Diagnosis not present

## 2018-01-20 DIAGNOSIS — R3121 Asymptomatic microscopic hematuria: Secondary | ICD-10-CM | POA: Diagnosis not present

## 2018-06-23 DIAGNOSIS — E039 Hypothyroidism, unspecified: Secondary | ICD-10-CM | POA: Diagnosis not present

## 2018-06-23 DIAGNOSIS — R5383 Other fatigue: Secondary | ICD-10-CM | POA: Diagnosis not present

## 2018-06-23 DIAGNOSIS — R319 Hematuria, unspecified: Secondary | ICD-10-CM | POA: Diagnosis not present

## 2018-06-23 DIAGNOSIS — E890 Postprocedural hypothyroidism: Secondary | ICD-10-CM | POA: Diagnosis not present

## 2018-06-30 DIAGNOSIS — E781 Pure hyperglyceridemia: Secondary | ICD-10-CM | POA: Diagnosis not present

## 2018-06-30 DIAGNOSIS — Z23 Encounter for immunization: Secondary | ICD-10-CM | POA: Diagnosis not present

## 2018-06-30 DIAGNOSIS — E890 Postprocedural hypothyroidism: Secondary | ICD-10-CM | POA: Diagnosis not present

## 2018-06-30 DIAGNOSIS — R319 Hematuria, unspecified: Secondary | ICD-10-CM | POA: Diagnosis not present

## 2018-09-08 DIAGNOSIS — E781 Pure hyperglyceridemia: Secondary | ICD-10-CM | POA: Diagnosis not present

## 2018-09-08 DIAGNOSIS — E890 Postprocedural hypothyroidism: Secondary | ICD-10-CM | POA: Diagnosis not present

## 2018-09-16 DIAGNOSIS — Z780 Asymptomatic menopausal state: Secondary | ICD-10-CM | POA: Diagnosis not present

## 2018-09-16 DIAGNOSIS — E781 Pure hyperglyceridemia: Secondary | ICD-10-CM | POA: Diagnosis not present

## 2018-09-16 DIAGNOSIS — E890 Postprocedural hypothyroidism: Secondary | ICD-10-CM | POA: Diagnosis not present

## 2018-12-24 DIAGNOSIS — E890 Postprocedural hypothyroidism: Secondary | ICD-10-CM | POA: Diagnosis not present

## 2018-12-24 DIAGNOSIS — E781 Pure hyperglyceridemia: Secondary | ICD-10-CM | POA: Diagnosis not present

## 2018-12-24 DIAGNOSIS — M810 Age-related osteoporosis without current pathological fracture: Secondary | ICD-10-CM | POA: Diagnosis not present

## 2018-12-31 DIAGNOSIS — M810 Age-related osteoporosis without current pathological fracture: Secondary | ICD-10-CM | POA: Diagnosis not present

## 2018-12-31 DIAGNOSIS — R319 Hematuria, unspecified: Secondary | ICD-10-CM | POA: Diagnosis not present

## 2018-12-31 DIAGNOSIS — E890 Postprocedural hypothyroidism: Secondary | ICD-10-CM | POA: Diagnosis not present

## 2018-12-31 DIAGNOSIS — E781 Pure hyperglyceridemia: Secondary | ICD-10-CM | POA: Diagnosis not present

## 2019-07-01 DIAGNOSIS — E781 Pure hyperglyceridemia: Secondary | ICD-10-CM | POA: Diagnosis not present

## 2019-07-01 DIAGNOSIS — E890 Postprocedural hypothyroidism: Secondary | ICD-10-CM | POA: Diagnosis not present

## 2019-07-07 DIAGNOSIS — E890 Postprocedural hypothyroidism: Secondary | ICD-10-CM | POA: Diagnosis not present

## 2019-07-07 DIAGNOSIS — R5383 Other fatigue: Secondary | ICD-10-CM | POA: Diagnosis not present

## 2019-07-07 DIAGNOSIS — Z23 Encounter for immunization: Secondary | ICD-10-CM | POA: Diagnosis not present

## 2019-07-07 DIAGNOSIS — Z0000 Encounter for general adult medical examination without abnormal findings: Secondary | ICD-10-CM | POA: Diagnosis not present

## 2019-07-07 DIAGNOSIS — M810 Age-related osteoporosis without current pathological fracture: Secondary | ICD-10-CM | POA: Diagnosis not present

## 2019-07-28 DIAGNOSIS — M810 Age-related osteoporosis without current pathological fracture: Secondary | ICD-10-CM | POA: Diagnosis not present

## 2019-10-24 ENCOUNTER — Ambulatory Visit: Payer: Self-pay | Attending: Internal Medicine

## 2019-10-24 DIAGNOSIS — Z23 Encounter for immunization: Secondary | ICD-10-CM | POA: Insufficient documentation

## 2019-10-24 NOTE — Progress Notes (Signed)
   Covid-19 Vaccination Clinic  Name:  ZAKEYA JUNKER    MRN: [id number] DOB: 10/07/1951  10/24/2019  Ms. Short was observed post Covid-19 immunization for 15 minutes without incidence. She was provided with Vaccine Information Sheet and instruction to access the V-Safe system.   Ms. Balla was instructed to call 911 with any severe reactions post vaccine: Marland Kitchen Difficulty breathing  . Swelling of your face and throat  . A fast heartbeat  . A bad rash all over your body  . Dizziness and weakness    Immunizations Administered    Name Date Dose VIS Date Route   Pfizer COVID-19 Vaccine 10/24/2019 11:22 AM 0.3 mL 09/18/2019 Intramuscular   Manufacturer: [employer], [employer]   Lot: [id number]   NDC: [address]-0

## 2019-11-13 ENCOUNTER — Ambulatory Visit: Payer: Self-pay | Attending: Internal Medicine

## 2019-11-13 DIAGNOSIS — Z23 Encounter for immunization: Secondary | ICD-10-CM | POA: Insufficient documentation

## 2019-11-13 NOTE — Progress Notes (Signed)
   Covid-19 Vaccination Clinic  Name:  Angela Gutierrez    MRN: [id number] DOB: 1951/07/25  11/13/2019  [name] was observed post Covid-19 immunization for 15 minutes without incidence. She was provided with Vaccine Information Sheet and instruction to access the V-Safe system.   [name] was instructed to call 911 with any severe reactions post vaccine: Marland Kitchen Difficulty breathing  . Swelling of your face and throat  . A fast heartbeat  . A bad rash all over your body  . Dizziness and weakness    Immunizations Administered    Name Date Dose VIS Date Route   Pfizer COVID-19 Vaccine 11/13/2019  8:46 AM 0.3 mL 09/18/2019 Intramuscular   Manufacturer: [employer], [employer]   Lot: [id number]   NDC: [address]-0

## 2019-11-16 ENCOUNTER — Ambulatory Visit: Payer: Self-pay

## 2020-01-16 ENCOUNTER — Emergency Department (HOSPITAL_COMMUNITY): Payer: BC Managed Care – PPO

## 2020-01-16 ENCOUNTER — Emergency Department (HOSPITAL_COMMUNITY)
Admission: EM | Admit: 2020-01-16 | Discharge: 2020-01-16 | Disposition: A | Discharge status: Home / Self Care | Payer: BC Managed Care – PPO | Attending: Emergency Medicine | Admitting: Emergency Medicine

## 2020-01-16 DIAGNOSIS — S299XXA Unspecified injury of thorax, initial encounter: Secondary | ICD-10-CM | POA: Diagnosis not present

## 2020-01-16 DIAGNOSIS — I10 Essential (primary) hypertension: Secondary | ICD-10-CM | POA: Diagnosis not present

## 2020-01-16 DIAGNOSIS — Y9389 Activity, other specified: Secondary | ICD-10-CM | POA: Diagnosis not present

## 2020-01-16 DIAGNOSIS — Y92410 Unspecified street and highway as the place of occurrence of the external cause: Secondary | ICD-10-CM | POA: Insufficient documentation

## 2020-01-16 DIAGNOSIS — R079 Chest pain, unspecified: Secondary | ICD-10-CM

## 2020-01-16 DIAGNOSIS — R0789 Other chest pain: Secondary | ICD-10-CM | POA: Diagnosis not present

## 2020-01-16 DIAGNOSIS — Y999 Unspecified external cause status: Secondary | ICD-10-CM | POA: Diagnosis not present

## 2020-01-16 LAB — I-STAT CREATININE, ED: Creatinine, Ser: 0.6 mg/dL (ref 0.44–1.00)

## 2020-01-16 MED ORDER — HYDROCODONE-ACETAMINOPHEN 5-325 MG PO TABS: 1.0000 | ORAL_TABLET | Freq: Four times a day (QID) | ORAL | 0 refills | Status: AC | PRN

## 2020-01-16 MED ORDER — IOHEXOL 300 MG/ML  SOLN
75.0000 mL | Freq: Once | INTRAMUSCULAR | Status: AC | PRN
  Administered 2020-01-16: 75 mL via INTRAVENOUS

## 2020-01-16 MED ORDER — HYDROCODONE-ACETAMINOPHEN 5-325 MG PO TABS
1.0000 | ORAL_TABLET | Freq: Once | ORAL | Status: AC
  Administered 2020-01-16: 19:00:00 1 via ORAL
  Filled 2020-01-16: qty 1
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
## 2020-01-16 NOTE — ED Notes (Signed)
Pt taken to CT.
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
## 2020-01-19 DIAGNOSIS — E890 Postprocedural hypothyroidism: Secondary | ICD-10-CM | POA: Diagnosis not present

## 2020-01-19 DIAGNOSIS — E559 Vitamin D deficiency, unspecified: Secondary | ICD-10-CM | POA: Diagnosis not present

## 2020-01-19 DIAGNOSIS — Z0000 Encounter for general adult medical examination without abnormal findings: Secondary | ICD-10-CM | POA: Diagnosis not present

## 2020-01-19 DIAGNOSIS — R5383 Other fatigue: Secondary | ICD-10-CM | POA: Diagnosis not present

## 2020-01-20 DIAGNOSIS — Z0000 Encounter for general adult medical examination without abnormal findings: Secondary | ICD-10-CM | POA: Diagnosis not present

## 2020-01-20 DIAGNOSIS — N390 Urinary tract infection, site not specified: Secondary | ICD-10-CM | POA: Diagnosis not present

## 2020-01-25 ENCOUNTER — Telehealth: Payer: Self-pay | Admitting: Cardiology

## 2020-01-25 NOTE — Telephone Encounter (Signed)
    I went in pt's chart to see what her ER note said

## 2020-01-26 DIAGNOSIS — I2510 Atherosclerotic heart disease of native coronary artery without angina pectoris: Secondary | ICD-10-CM | POA: Diagnosis not present

## 2020-01-26 DIAGNOSIS — R072 Precordial pain: Secondary | ICD-10-CM | POA: Diagnosis not present

## 2020-01-26 DIAGNOSIS — E782 Mixed hyperlipidemia: Secondary | ICD-10-CM | POA: Diagnosis not present

## 2020-01-26 DIAGNOSIS — Z0000 Encounter for general adult medical examination without abnormal findings: Secondary | ICD-10-CM | POA: Diagnosis not present

## 2020-01-26 DIAGNOSIS — R0602 Shortness of breath: Secondary | ICD-10-CM | POA: Diagnosis not present

## 2020-02-11 DIAGNOSIS — R072 Precordial pain: Secondary | ICD-10-CM | POA: Diagnosis not present

## 2020-02-11 DIAGNOSIS — R0602 Shortness of breath: Secondary | ICD-10-CM | POA: Diagnosis not present

## 2020-02-16 DIAGNOSIS — S46002A Unspecified injury of muscle(s) and tendon(s) of the rotator cuff of left shoulder, initial encounter: Secondary | ICD-10-CM | POA: Diagnosis not present

## 2020-02-16 DIAGNOSIS — R0789 Other chest pain: Secondary | ICD-10-CM | POA: Diagnosis not present

## 2020-02-16 DIAGNOSIS — E782 Mixed hyperlipidemia: Secondary | ICD-10-CM | POA: Diagnosis not present

## 2020-03-01 DIAGNOSIS — M25512 Pain in left shoulder: Secondary | ICD-10-CM | POA: Diagnosis not present

## 2020-03-15 DIAGNOSIS — E782 Mixed hyperlipidemia: Secondary | ICD-10-CM | POA: Diagnosis not present

## 2020-03-21 DIAGNOSIS — E782 Mixed hyperlipidemia: Secondary | ICD-10-CM | POA: Diagnosis not present

## 2020-04-08 DIAGNOSIS — M25512 Pain in left shoulder: Secondary | ICD-10-CM | POA: Diagnosis not present

## 2020-07-18 DIAGNOSIS — N200 Calculus of kidney: Secondary | ICD-10-CM | POA: Diagnosis not present

## 2020-07-18 DIAGNOSIS — R3121 Asymptomatic microscopic hematuria: Secondary | ICD-10-CM | POA: Diagnosis not present

## 2021-04-07 ENCOUNTER — Other Ambulatory Visit: Payer: Self-pay | Admitting: Internal Medicine

## 2021-04-07 DIAGNOSIS — Z1231 Encounter for screening mammogram for malignant neoplasm of breast: Secondary | ICD-10-CM

## 2021-06-07 ENCOUNTER — Ambulatory Visit
Admission: RE | Admit: 2021-06-07 | Discharge: 2021-06-07 | Disposition: A | Discharge status: Home / Self Care | Payer: BC Managed Care – PPO | Source: Ambulatory Visit | Attending: Internal Medicine | Admitting: Internal Medicine

## 2021-06-07 ENCOUNTER — Other Ambulatory Visit: Payer: Self-pay

## 2021-06-07 DIAGNOSIS — Z1231 Encounter for screening mammogram for malignant neoplasm of breast: Secondary | ICD-10-CM

## 2021-06-13 ENCOUNTER — Other Ambulatory Visit: Payer: Self-pay | Admitting: Internal Medicine

## 2021-06-13 DIAGNOSIS — R928 Other abnormal and inconclusive findings on diagnostic imaging of breast: Secondary | ICD-10-CM

## 2021-06-29 ENCOUNTER — Ambulatory Visit
Admission: RE | Admit: 2021-06-29 | Discharge: 2021-06-29 | Disposition: A | Discharge status: Home / Self Care | Payer: BC Managed Care – PPO | Source: Ambulatory Visit | Attending: Internal Medicine | Admitting: Internal Medicine

## 2021-06-29 ENCOUNTER — Other Ambulatory Visit: Payer: Self-pay

## 2021-06-29 DIAGNOSIS — R928 Other abnormal and inconclusive findings on diagnostic imaging of breast: Secondary | ICD-10-CM

## 2021-09-19 ENCOUNTER — Other Ambulatory Visit (HOSPITAL_BASED_OUTPATIENT_CLINIC_OR_DEPARTMENT_OTHER): Payer: Self-pay

## 2021-09-19 ENCOUNTER — Telehealth (HOSPITAL_BASED_OUTPATIENT_CLINIC_OR_DEPARTMENT_OTHER): Payer: Self-pay | Admitting: Pharmacist

## 2021-09-19 MED ORDER — PAXLOVID (300/100) 20 X 150 MG & 10 X 100MG PO TBPK
ORAL_TABLET | ORAL | 0 refills | Status: AC
  Filled 2021-09-19: qty 30, 5d supply, fill #0

## 2021-09-19 MED ORDER — CARESTART COVID-19 HOME TEST VI KIT
PACK | 0 refills | Status: DC
  Filled 2021-09-19: qty 4, 4d supply, fill #0

## 2021-09-19 NOTE — Telephone Encounter (Signed)
Outpatient Oral COVID Treatment Note  I connected with Angela Gutierrez via her daughter on 09/19/2021/8:48 AM by telephone and verified that I am speaking with the correct person using two identifiers.  I discussed the limitations, risks, security, and privacy concerns of performing an evaluation and management service by telephone and the availability of in person appointments. I also discussed with the patient that there may be a patient responsible charge related to this service. The patient expressed understanding and agreed to proceed.  Patient location:  Provider location: [facility]  Diagnosis: COVID-19 infection  Purpose of visit: Discussion of potential use of Molnupiravir or Paxlovid, a new treatment for mild to moderate COVID-19 viral infection in non-hospitalized patients.   Subjective: Patient is a 70 y.o. female who has been diagnosed with COVID 19 viral infection.  Their symptoms began on Saturday, December 11 with cough and congestion.    Past Medical History:  Diagnosis Date   Graves disease 09/2012    No Known Allergies   Current Outpatient Medications:    Cholecalciferol (VITAMIN D3 PO), Take 10,000 Units by mouth See admin instructions. Twice week , Tuesday and Thurday, Disp: , Rfl:    levothyroxine (SYNTHROID, LEVOTHROID) 75 MCG tablet, Take 75 mcg by mouth daily before breakfast., Disp: , Rfl:    nirmatrelvir & ritonavir (PAXLOVID, 300/100,) 20 x 150 MG & 10 x 100MG  TBPK, Take 3 tablets by mouth twice daily for 5 days as directed on package. Discontinue lipitor while on paxlovid., Disp: 30 tablet, Rfl: 0   Zoster Vac Recomb Adjuvanted (SHINGRIX) injection, Inject 0.5 mLs into the muscle every 8 (eight) weeks., Disp: 0.5 mL, Rfl: 1  Patient will discontinue lipitor while on paxlovid.    Laboratory Data:  No results found for this or any previous visit (from the past 2160 hour(s)).  Verified labs via patient portal provided by daughter. Serum Cr is 0.6  giving estimated GFR >100.  Assessment: 70 y.o. female with mild/moderate COVID 19 viral infection diagnosed Dec 12 at high risk for progression to severe COVID 19.  Plan:  This patient is a 70 y.o. female that meets the following criteria for Emergency Use Authorization of: Paxlovid 1. Age >12 yr AND > 40 kg 2. SARS-COV-2 positive test 3. Symptom onset < 5 days 4. Mild-to-moderate COVID disease with high risk for severe progression to hospitalization or death  I have spoken and communicated the following to the patient or parent/caregiver regarding: Paxlovid is an unapproved drug that is authorized for use under an Emergency Use Authorization.  There are no adequate, approved, available products for the treatment of COVID-19 in adults who have mild-to-moderate COVID-19 and are at high risk for progressing to severe COVID-19, including hospitalization or death. Other therapeutics are currently authorized. For additional information on all products authorized for treatment or prevention of COVID-19, please see 73.  There are benefits and risks of taking this treatment as outlined in the "Fact Sheet for Patients and Caregivers."  "Fact Sheet for Patients and Caregivers" was reviewed with patient. A hard copy will be provided to patient from pharmacy prior to the patient receiving treatment. Patients should continue to self-isolate and use infection control measures (e.g., wear mask, isolate, social distance, avoid sharing personal items, clean and disinfect "high touch" surfaces, and frequent handwashing) according to CDC guidelines.  The patient or parent/caregiver has the option to accept or refuse treatment. Patient medication history was reviewed for potential drug interactions: Patient will stop lipitor while on paxlovid Patient's  GFR was calculated to be >100, and they were  therefore prescribed Normal dose (GFR>60) - nirmatrelvir 150mg  tab (2 tablet) by mouth twice daily AND ritonavir 100mg  tab (1 tablet) by mouth twice daily  After reviewing above information with the patient, the patient agrees to receive Paxlovid.  Follow up instructions:    Take prescription BID x 5 days as directed Reach out to pharmacist for counseling on medication if desired For concerns regarding further COVID symptoms please follow up with your PCP or urgent care For urgent or life-threatening issues, seek care at your local emergency department  The patient was provided an opportunity to ask questions, and all were answered. The patient agreed with the plan and demonstrated an understanding of the instructions.   The patient was advised to call their PCP or seek an in-person evaluation if the symptoms worsen or if the condition fails to improve as anticipated.   , [facility] 09/19/2021 /8:48 AM

## 2021-09-22 ENCOUNTER — Ambulatory Visit (INDEPENDENT_AMBULATORY_CARE_PROVIDER_SITE_OTHER): Payer: BC Managed Care – PPO

## 2021-09-22 ENCOUNTER — Ambulatory Visit (HOSPITAL_COMMUNITY)
Admission: EM | Admit: 2021-09-22 | Discharge: 2021-09-22 | Payer: BC Managed Care – PPO | Attending: Physician Assistant | Admitting: Physician Assistant

## 2021-09-22 ENCOUNTER — Other Ambulatory Visit: Payer: Self-pay

## 2021-09-22 ENCOUNTER — Emergency Department (HOSPITAL_COMMUNITY): Payer: BC Managed Care – PPO

## 2021-09-22 ENCOUNTER — Encounter (HOSPITAL_COMMUNITY): Payer: Self-pay

## 2021-09-22 ENCOUNTER — Emergency Department (HOSPITAL_COMMUNITY)
Admission: EM | Admit: 2021-09-22 | Discharge: 2021-09-22 | Disposition: A | Discharge status: Home / Self Care | Payer: BC Managed Care – PPO | Attending: Emergency Medicine | Admitting: Emergency Medicine

## 2021-09-22 DIAGNOSIS — R079 Chest pain, unspecified: Secondary | ICD-10-CM

## 2021-09-22 DIAGNOSIS — R0789 Other chest pain: Secondary | ICD-10-CM | POA: Diagnosis not present

## 2021-09-22 DIAGNOSIS — M25512 Pain in left shoulder: Secondary | ICD-10-CM

## 2021-09-22 DIAGNOSIS — U071 COVID-19: Secondary | ICD-10-CM

## 2021-09-22 LAB — CBC
HCT: 42.3 % (ref 36.0–46.0)
Hemoglobin: 14.1 g/dL (ref 12.0–15.0)
MCH: 32 pg (ref 26.0–34.0)
MCHC: 33.3 g/dL (ref 30.0–36.0)
MCV: 95.9 fL (ref 80.0–100.0)
Platelets: 213 10*3/uL (ref 150–400)
RBC: 4.41 MIL/uL (ref 3.87–5.11)
RDW: 12.6 % (ref 11.5–15.5)
WBC: 9.4 10*3/uL (ref 4.0–10.5)
nRBC: 0 % (ref 0.0–0.2)

## 2021-09-22 LAB — BASIC METABOLIC PANEL
Anion gap: 11 (ref 5–15)
BUN: 19 mg/dL (ref 8–23)
CO2: 25 mmol/L (ref 22–32)
Calcium: 9.4 mg/dL (ref 8.9–10.3)
Chloride: 99 mmol/L (ref 98–111)
Creatinine, Ser: 0.74 mg/dL (ref 0.44–1.00)
GFR, Estimated: >60 mL/min (ref >60)
Glucose, Bld: 102 mg/dL — ABNORMAL HIGH (ref 70–99)
Potassium: 3.3 mmol/L — ABNORMAL LOW (ref 3.5–5.1)
Sodium: 135 mmol/L (ref 135–145)

## 2021-09-22 LAB — TROPONIN I (HIGH SENSITIVITY)
Troponin I (High Sensitivity): 6 ng/L (ref <18)
Troponin I (High Sensitivity): 5 ng/L (ref <18)

## 2021-09-22 MED ORDER — HYDROCODONE-ACETAMINOPHEN 5-325 MG PO TABS: 1.0000 | ORAL_TABLET | Freq: Four times a day (QID) | ORAL | 0 refills | Status: AC | PRN

## 2021-09-22 MED ORDER — HYDROCODONE-ACETAMINOPHEN 5-325 MG PO TABS
1.0000 | ORAL_TABLET | Freq: Once | ORAL | Status: AC
  Administered 2021-09-22: 1 via ORAL
  Filled 2021-09-22: qty 1

## 2021-09-22 NOTE — ED Provider Notes (Signed)
[facility]    CSN: [id number] Arrival date & time: 09/22/21  1624      History   Chief Complaint Chief Complaint  Patient presents with   Cough   Chest Pain    HPI Angela Gutierrez is a 70 y.o. female.   Patient presents today accompanied by her daughter help provide the majority of history.  Reports that earlier today she woke up with severe left upper chest pain/left shoulder pain.  Pain is rated 8 on a 0-10 pain scale, localized to left shoulder with some radiation towards clavicle, described as sharp, worse with certain movements, no alleviating factors identified.  She denies any associated shortness of breath, nausea, vomiting, diaphoresis.  She denies history of malignancy, previous VTE event, exogenous estrogen use.  Denies history of heart disease or significant risk factors including smoking, diabetes, hypertension, family history.  She was recently diagnosed with COVID-19 approximately 1 week ago and is completing course of Paxlovid; has one day left of medicine.  Reports that COVID symptoms have improved and she has very minimal cough, nasal congestion.  Denies any fever, nausea, vomiting.  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   Past Medical History:  Diagnosis Date   Graves disease 09/2012    Patient Active Problem List   Diagnosis Date Noted   Hyperthyroidism 09/12/2012    History reviewed. No pertinent surgical history.  OB History   No obstetric history on file.      Home Medications    Prior to Admission medications   Medication Sig Start Date End Date Taking? Authorizing Provider  Cholecalciferol (VITAMIN D3 PO) Take 10,000 Units by mouth See admin instructions. Twice week , Tuesday and Thurday    [provider]  levothyroxine (SYNTHROID,  LEVOTHROID) 75 MCG tablet Take 75 mcg by mouth daily before breakfast.    [provider]  nirmatrelvir & ritonavir (PAXLOVID, 300/100,) 20 x 150 MG & 10 x 100MG  TBPK Take 3 tablets by mouth twice daily for 5 days as directed on package. Discontinue lipitor while on paxlovid. 09/19/21   09/21/21, MD  Zoster Vac Recomb Adjuvanted [facility]) injection Inject 0.5 mLs into the muscle every 8 (eight) weeks. 05/21/17   05/23/17, RPH-CPP    Family History Family History  Problem Relation Age of Onset   Hypertension Mother    Cancer Father     Social History Social History   Tobacco Use   Smoking status: Never  Substance Use Topics   Alcohol use: No   Drug use: No     Allergies   Patient has no known allergies.   Review of Systems Review of Systems  Constitutional:  Positive for activity change. Negative for appetite change, fatigue and fever.  Respiratory:  Positive for cough. Negative for shortness of breath.   Cardiovascular:  Positive for chest pain.  Gastrointestinal:  Negative for abdominal pain, diarrhea, nausea and vomiting.  Musculoskeletal:  Positive for arthralgias and myalgias.  Neurological:  Negative for dizziness, light-headedness and headaches.    Physical Exam Triage Vital Signs ED Triage Vitals  Enc Vitals Group     BP 09/22/21 1721 (!) 150/88     Pulse Rate 09/22/21 1721 87     Resp 09/22/21 1721 19     Temp 09/22/21 1721 99.4 F (  37.4 C)     Temp Source 09/22/21 1721 Oral     SpO2 09/22/21 1721 97 %     Weight --      Height --      Head Circumference --      Peak Flow --      Pain Score 09/22/21 1720 8     Pain Loc --      Pain Edu? --      Excl. in GC? --    No data found.  Updated Vital Signs BP (!) 150/88 (BP Location: Right Arm)    Pulse 87    Temp 99.4 F (37.4 C) (Oral)    Resp 19    SpO2 97%   Visual Acuity Right Eye Distance:   Left Eye Distance:   Bilateral Distance:    Right Eye Near:   Left Eye Near:     Bilateral Near:     Physical Exam Vitals reviewed.  Constitutional:      General: She is awake. She is not in acute distress.    Appearance: Normal appearance. She is well-developed. She is not ill-appearing.     Comments: Very pleasant female appears stated age in no acute distress sitting comfortably on exam room table  HENT:     Head: Normocephalic and atraumatic.  Cardiovascular:     Rate and Rhythm: Normal rate and regular rhythm.     Heart sounds: Normal heart sounds, S1 normal and S2 normal. No murmur heard. Pulmonary:     Effort: Pulmonary effort is normal.     Breath sounds: Normal breath sounds. No wheezing, rhonchi or rales.     Comments: Clear to auscultation bilaterally Chest:     Chest wall: No swelling, tenderness or crepitus.  Abdominal:     General: Bowel sounds are normal.     Palpations: Abdomen is soft.     Tenderness: There is no abdominal tenderness. There is no right CVA tenderness, left CVA tenderness, guarding or rebound.  Musculoskeletal:     Left shoulder: Tenderness and bony tenderness present. No swelling. Decreased range of motion. Normal strength.     Comments: Left shoulder: Significant tenderness palpation over AC joint.  Decreased range of motion with extension, overhead flexion, forward flexion, internal and external rotation.  Patient unable to position arm to assess rotator cuff function.  Hand neurovascularly intact.  Pain is reproducible at [facility] joint and along distal clavicle.  Psychiatric:        Behavior: Behavior is cooperative.     UC Treatments / Results  Labs (all labs ordered are listed, but only abnormal results are displayed) Labs Reviewed - No data to display  EKG   Radiology DG Chest 2 View  Result Date: 09/22/2021 CLINICAL DATA:  Shoulder and chest pain EXAM: CHEST - 2 VIEW COMPARISON:  None. FINDINGS: The heart size and mediastinal contours are within normal limits. Both lungs are clear. Mild bronchitic changes. Possible  vague nodule in the left upper lobe. The visualized skeletal structures are unremarkable. IMPRESSION: No active cardiopulmonary disease. Possible vague nodule in the left upper lobe. Chest CT may be obtained for further evaluation Electronically Signed   By: Jasmine Pang M.D.   On: 09/22/2021 18:06   DG Shoulder Left  Result Date: 09/22/2021 CLINICAL DATA:  Shoulder pain EXAM: LEFT SHOULDER - 2+ VIEW COMPARISON:  None. FINDINGS: AC joint is intact. No fracture or malalignment. Mild amount of calcific tendinopathy. Questionable nodule in the left upper lobe. IMPRESSION:  No acute osseous abnormality. Mild amount of calcific tendinopathy. Ovoid nodular opacity in the left upper lobe. See chest x-ray report. Electronically Signed   By: Jasmine Pang M.D.   On: 09/22/2021 18:06    Procedures Procedures (including critical care time)  Medications Ordered in UC Medications - No data to display  Initial Impression / Assessment and Plan / UC Course  I have reviewed the triage vital signs and the nursing notes.  Pertinent labs & imaging results that were available during my care of the patient were reviewed by me and considered in my medical decision making (see chart for details).     EKG obtained showed normal sinus rhythm with nonspecific ST changes in lead III compared to 01/15/2021 tracing without ischemic changes.  Chest x-ray showed a possible nodule without infiltrate.  Left shoulder x-ray showed calcified tendinopathy.  Pain is not exactly reproducible on exam and given she is having chest pain in the setting of recent COVID did recommend that she go to the emergency room to consider troponins and rule out NSTEMI or other cardiopulmonary condition.  Low concern for PE based on Wells criteria but unable to [facility] patient given age.  Daughter expressed concern given sudden onset of symptoms with recent COVID diagnosis and is agreeable to taking her to the emergency room.  Her vital signs are stable at  the time of discharge and she will go directly to [facility], ER for further evaluation and management.  Final Clinical Impressions(s) / UC Diagnoses   Final diagnoses:  Chest pain, unspecified type  Acute pain of left shoulder  COVID-19   Discharge Instructions   None    ED Prescriptions   None    PDMP not reviewed this encounter.   Jeani Hawking, PA-C 09/22/21 1832

## 2021-09-22 NOTE — ED Triage Notes (Signed)
Pt presents with a cpugh x 1 week. Daughter states pt was given Paxlovid and states pt woke up this morning around 8 with L side shoulder and chest pain. States pt was diagnosed with Frozen Shoulder and was given medication for that. States the pain went away after.

## 2021-09-22 NOTE — ED Provider Notes (Signed)
Emergency Medicine Provider Triage Evaluation Note  Angela Gutierrez , a 70 y.o. female  was evaluated in triage.  Pt complains of chest pain and left shoulder pain.  This started acutely today, no nausea or vomiting or history of previous cardiac event.  Patient is COVID-positive per home test 1 week ago.  The chest pain is new..  Review of Systems  Positive: cp Negative:   Physical Exam  BP (!) 162/100 (BP Location: Right Arm)    Pulse 96    Temp 98.9 F (37.2 C) (Oral)    Resp 16    Ht 4\' 11"  (1.499 m)    Wt 51 kg    SpO2 98%    BMI 22.71 kg/m  Gen:   Awake, no distress   Resp:  Normal effort  MSK:   Moves extremities without difficulty  Other:  Radial pulse 2+ and equal bilaterally.  S1-S2, lungs clear to auscultation  Medical Decision Making  Medically screening exam initiated at 6:46 PM.  Appropriate orders placed.  VINA BYRD was informed that the remainder of the evaluation will be completed by another provider, this initial triage assessment does not replace that evaluation, and the importance of remaining in the ED until their evaluation is complete.  CP   Gordy Clement, Theron Arista 09/22/21 1847    09/24/21, MD 09/22/21 [phone]

## 2021-09-22 NOTE — ED Notes (Signed)
Patient is being discharged from the Urgent Care and sent to the Emergency Department via personal vehicle with family . Per Provider Dorann Ou, patient is in need of higher level of care due to chest pain. Patient is aware and verbalizes understanding of plan of care.  Vitals:   09/22/21 1721  BP: (!) 150/88  Pulse: 87  Resp: 19  Temp: 99.4 F (37.4 C)  SpO2: 97%

## 2021-09-22 NOTE — Discharge Instructions (Signed)
Follow-up with your orthopedic doctor for your shoulder pain.  Follow-up with your family doctor if any other problems

## 2021-09-22 NOTE — ED Triage Notes (Addendum)
Pt arrives POV for eval of constant Chest/back pain involving L shoulder and back. Pt reports worse w/ movement and palpation. Pt reports she awoke and then noted the pain, it did not wake her from sleep. Pt was recently dx'd w/ Covid and given paxlovid at that time

## 2021-09-22 NOTE — ED Provider Notes (Signed)
[facility] EMERGENCY DEPARTMENT Provider Note   CSN: [id number] Arrival date & time: 09/22/21  1833     History Chief Complaint  Patient presents with   Chest Pain    Angela Gutierrez is a 70 y.o. female.  Patient with COVID.  Patient complains of chest pain the pain is in the left shoulder and left chest.  Is worse with movement  The history is provided by the patient and medical records. No language interpreter was used.  Chest Pain Pain location:  L chest Pain quality: aching   Radiates to: Left shoulder. Pain severity:  Moderate Onset quality:  Sudden Timing:  Intermittent Progression:  Waxing and waning Chronicity:  New Relieved by:  Nothing Worsened by:  Nothing Ineffective treatments:  None tried Associated symptoms: no abdominal pain, no back pain, no cough, no fatigue and no headache       Past Medical History:  Diagnosis Date   Graves disease 09/2012    Patient Active Problem List   Diagnosis Date Noted   Hyperthyroidism 09/12/2012    History reviewed. No pertinent surgical history.   OB History   No obstetric history on file.     Family History  Problem Relation Age of Onset   Hypertension Mother    Cancer Father     Social History   Tobacco Use   Smoking status: Never  Substance Use Topics   Alcohol use: No   Drug use: No    Home Medications Prior to Admission medications   Medication Sig Start Date End Date Taking? Authorizing Provider  atorvastatin (LIPITOR) 20 MG tablet Take 20 mg by mouth every evening.   Yes [provider]  Cholecalciferol (VITAMIN D3 PO) Take 5,000 Units by mouth daily.   Yes [provider]  HYDROcodone-acetaminophen (NORCO/VICODIN) 5-325 MG tablet Take 1 tablet by mouth every 6 (six) hours as needed for moderate pain. 09/22/21  Yes Bethann Berkshire, MD  levothyroxine (SYNTHROID) 50 MCG tablet Take 50 mcg by mouth every morning.   Yes [provider]  nirmatrelvir &  ritonavir (PAXLOVID, 300/100,) 20 x 150 MG & 10 x 100MG  TBPK Take 3 tablets by mouth twice daily for 5 days as directed on package. Discontinue lipitor while on paxlovid. Patient taking differently: Take 3 tablets by mouth 2 (two) times daily. X 5 days. Started on 12/13. 09/19/21  Yes 09/21/21, MD  Zoster Vac Recomb Adjuvanted [facility]) injection Inject 0.5 mLs into the muscle every 8 (eight) weeks. Patient not taking: Reported on 09/22/2021 05/21/17   05/23/17, RPH-CPP    Allergies    Patient has no known allergies.  Review of Systems   Review of Systems  Constitutional:  Negative for appetite change and fatigue.  HENT:  Negative for congestion, ear discharge and sinus pressure.   Eyes:  Negative for discharge.  Respiratory:  Negative for cough.   Cardiovascular:  Positive for chest pain.  Gastrointestinal:  Negative for abdominal pain and diarrhea.  Genitourinary:  Negative for frequency and hematuria.  Musculoskeletal:  Negative for back pain.       Tender left shoulder  Skin:  Negative for rash.  Neurological:  Negative for seizures and headaches.  Psychiatric/Behavioral:  Negative for hallucinations.    Physical Exam Updated Vital Signs BP (!) 146/78 (BP Location: Right Arm)    Pulse 72    Temp 98.9 F (37.2 C) (Oral)    Resp 16    Ht 4\' 11"  (1.499  m)    Wt 51 kg    SpO2 98%    BMI 22.71 kg/m   Physical Exam Vitals and nursing note reviewed.  Constitutional:      Appearance: She is well-developed.  HENT:     Head: Normocephalic.     Nose: Nose normal.  Eyes:     General: No scleral icterus.    Conjunctiva/sclera: Conjunctivae normal.  Neck:     Thyroid: No thyromegaly.  Cardiovascular:     Rate and Rhythm: Normal rate and regular rhythm.     Heart sounds: No murmur heard.   No friction rub. No gallop.  Pulmonary:     Breath sounds: No stridor. No wheezing or rales.  Chest:     Chest wall: Tenderness present.  Abdominal:     General: There is no  distension.     Tenderness: There is no abdominal tenderness. There is no rebound.  Musculoskeletal:        General: Normal range of motion.     Cervical back: Neck supple.     Comments: Tender left chest and shoulder  Lymphadenopathy:     Cervical: No cervical adenopathy.  Skin:    Findings: No erythema or rash.  Neurological:     Mental Status: She is alert and oriented to person, place, and time.     Motor: No abnormal muscle tone.     Coordination: Coordination normal.  Psychiatric:        Behavior: Behavior normal.    ED Results / Procedures / Treatments   Labs (all labs ordered are listed, but only abnormal results are displayed) Labs Reviewed  BASIC METABOLIC PANEL - Abnormal; Notable for the following components:      Result Value   Potassium 3.3 (*)    Glucose, Bld 102 (*)    All other components within normal limits  CBC  TROPONIN I (HIGH SENSITIVITY)  TROPONIN I (HIGH SENSITIVITY)    EKG None  Radiology DG Chest 2 View  Result Date: 09/22/2021 CLINICAL DATA:  Shoulder and chest pain EXAM: CHEST - 2 VIEW COMPARISON:  None. FINDINGS: The heart size and mediastinal contours are within normal limits. Both lungs are clear. Mild bronchitic changes. Possible vague nodule in the left upper lobe. The visualized skeletal structures are unremarkable. IMPRESSION: No active cardiopulmonary disease. Possible vague nodule in the left upper lobe. Chest CT may be obtained for further evaluation Electronically Signed   By: Jasmine Pang M.D.   On: 09/22/2021 18:06   DG Shoulder Left  Result Date: 09/22/2021 CLINICAL DATA:  Shoulder pain EXAM: LEFT SHOULDER - 2+ VIEW COMPARISON:  None. FINDINGS: AC joint is intact. No fracture or malalignment. Mild amount of calcific tendinopathy. Questionable nodule in the left upper lobe. IMPRESSION: No acute osseous abnormality. Mild amount of calcific tendinopathy. Ovoid nodular opacity in the left upper lobe. See chest x-ray report.  Electronically Signed   By: Jasmine Pang M.D.   On: 09/22/2021 18:06    Procedures Procedures   Medications Ordered in ED Medications  HYDROcodone-acetaminophen (NORCO/VICODIN) 5-325 MG per tablet 1 tablet (1 tablet Oral Given 09/22/21 2009)    ED Course  I have reviewed the triage vital signs and the nursing notes.  Pertinent labs & imaging results that were available during my care of the patient were reviewed by me and considered in my medical decision making (see chart for details).  Patient with pain and left-sided chest and left shoulder worse with movement. MDM Rules/Calculators/A&P  Patient with chest wall pain and shoulder pain.  She is given pain meds and will follow up with orthopedics    Final Clinical Impression(s) / ED Diagnoses Final diagnoses:  Atypical chest pain  Acute pain of left shoulder    Rx / DC Orders ED Discharge Orders          Ordered    HYDROcodone-acetaminophen (NORCO/VICODIN) 5-325 MG tablet  Every 6 hours PRN        09/22/21 2303             Milton Ferguson, MD 09/26/21 1132

## 2021-11-21 ENCOUNTER — Other Ambulatory Visit: Payer: Self-pay | Admitting: Internal Medicine

## 2021-11-21 DIAGNOSIS — R911 Solitary pulmonary nodule: Secondary | ICD-10-CM

## 2021-12-15 ENCOUNTER — Ambulatory Visit
Admission: RE | Admit: 2021-12-15 | Discharge: 2021-12-15 | Disposition: A | Discharge status: Home / Self Care | Payer: BC Managed Care – PPO | Source: Ambulatory Visit | Attending: Internal Medicine | Admitting: Internal Medicine

## 2021-12-15 DIAGNOSIS — R911 Solitary pulmonary nodule: Secondary | ICD-10-CM

## 2022-03-14 ENCOUNTER — Other Ambulatory Visit: Payer: Self-pay | Admitting: Internal Medicine

## 2022-03-14 DIAGNOSIS — Z1231 Encounter for screening mammogram for malignant neoplasm of breast: Secondary | ICD-10-CM

## 2022-03-23 ENCOUNTER — Ambulatory Visit: Payer: BC Managed Care – PPO | Attending: Internal Medicine

## 2022-03-23 ENCOUNTER — Other Ambulatory Visit (HOSPITAL_BASED_OUTPATIENT_CLINIC_OR_DEPARTMENT_OTHER): Payer: Self-pay

## 2022-03-23 DIAGNOSIS — Z23 Encounter for immunization: Secondary | ICD-10-CM

## 2022-03-23 MED ORDER — PFIZER COVID-19 VAC BIVALENT 30 MCG/0.3ML IM SUSP
INTRAMUSCULAR | 0 refills | Status: AC
  Filled 2022-03-23: qty 0.3, 1d supply, fill #0

## 2022-03-23 NOTE — Progress Notes (Signed)
   Covid-19 Vaccination Clinic  Name:  Angela Gutierrez    MRN: [id number] DOB: May 25, 1951  03/23/2022  [name] was observed post Covid-19 immunization for 15 minutes without incident. She was provided with Vaccine Information Sheet and instruction to access the V-Safe system.   [name] was instructed to call 911 with any severe reactions post vaccine: Difficulty breathing  Swelling of face and throat  A fast heartbeat  A bad rash all over body  Dizziness and weakness   Immunizations Administered     Name Date Dose VIS Date Route   Pfizer Covid-19 Vaccine Bivalent Booster 03/23/2022 10:00 AM 0.3 mL 06/07/2021 Intramuscular   Manufacturer: [employer], [employer]   Lot: [id number]   NDC: [phone]

## 2022-03-26 ENCOUNTER — Other Ambulatory Visit (HOSPITAL_BASED_OUTPATIENT_CLINIC_OR_DEPARTMENT_OTHER): Payer: Self-pay

## 2022-04-19 ENCOUNTER — Other Ambulatory Visit (HOSPITAL_BASED_OUTPATIENT_CLINIC_OR_DEPARTMENT_OTHER): Payer: Self-pay

## 2022-04-19 MED ORDER — ZOSTER VAC RECOMB ADJUVANTED 50 MCG/0.5ML IM SUSR
INTRAMUSCULAR | 1 refills | Status: AC
  Filled 2022-04-19: qty 0.5, 1d supply, fill #0
  Filled 2022-07-04: qty 0.5, 1d supply, fill #1

## 2022-06-08 ENCOUNTER — Ambulatory Visit
Admission: RE | Admit: 2022-06-08 | Discharge: 2022-06-08 | Disposition: A | Discharge status: Home / Self Care | Payer: BC Managed Care – PPO | Source: Ambulatory Visit | Attending: Internal Medicine | Admitting: Internal Medicine

## 2022-06-08 DIAGNOSIS — Z1231 Encounter for screening mammogram for malignant neoplasm of breast: Secondary | ICD-10-CM

## 2022-07-04 ENCOUNTER — Other Ambulatory Visit (HOSPITAL_BASED_OUTPATIENT_CLINIC_OR_DEPARTMENT_OTHER): Payer: Self-pay

## 2023-01-21 ENCOUNTER — Other Ambulatory Visit: Payer: Self-pay | Admitting: Internal Medicine

## 2023-01-21 DIAGNOSIS — Z1231 Encounter for screening mammogram for malignant neoplasm of breast: Secondary | ICD-10-CM

## 2023-02-06 ENCOUNTER — Other Ambulatory Visit (HOSPITAL_COMMUNITY): Payer: Self-pay

## 2023-02-22 IMAGING — CT CT CHEST W/O CM
1 of 2 series · 15 of 32 positions shown, 19 images · non-contrast
Comparison: Chest radiograph dated 09/22/2021.

CLINICAL DATA: Left upper lobe pulmonary nodule.



[Series 4: super d · axial · 0.56mm/px · z∈[-243,+8]mm · 15 of 352 slices shown, 19 images]
[im 19/352  mediastinal]
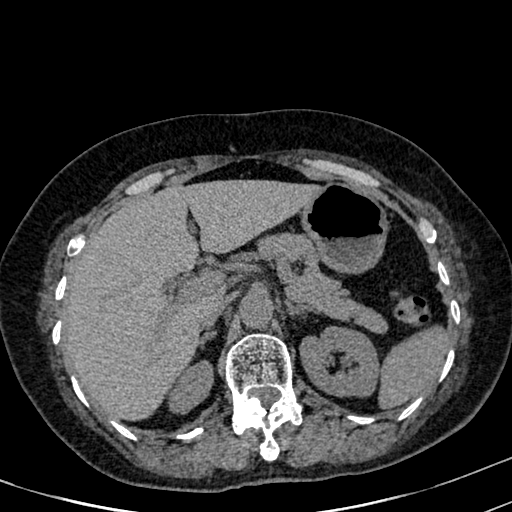
[im 19/352  lung]
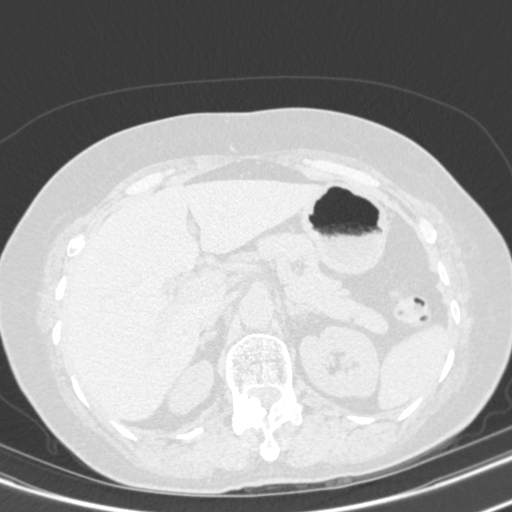
[im 56/352  lung]
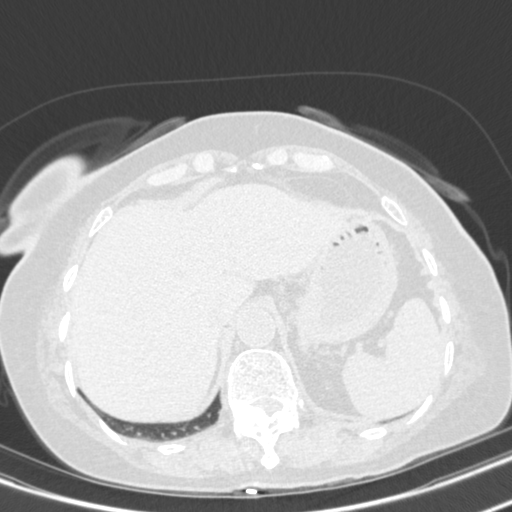
[im 74/352  lung]
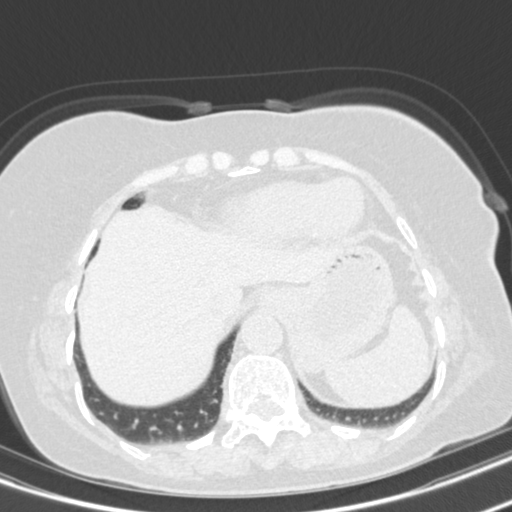
[im 93/352  lung]
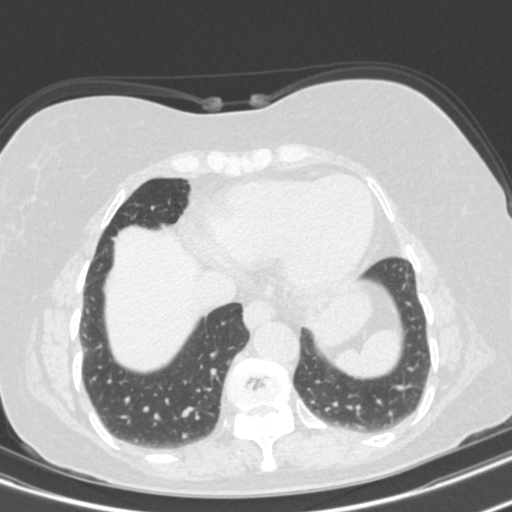
[im 118/352  mediastinal]
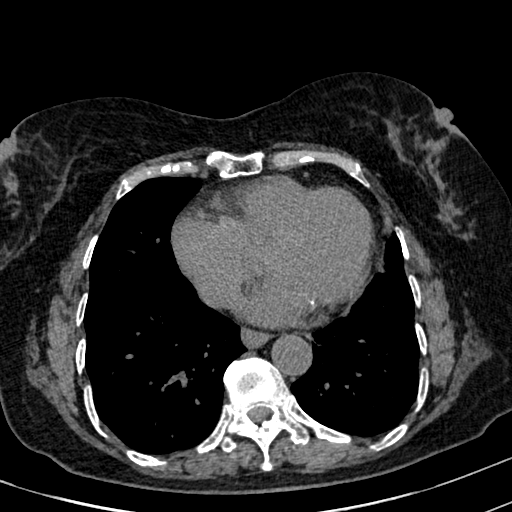
[im 118/352  lung]
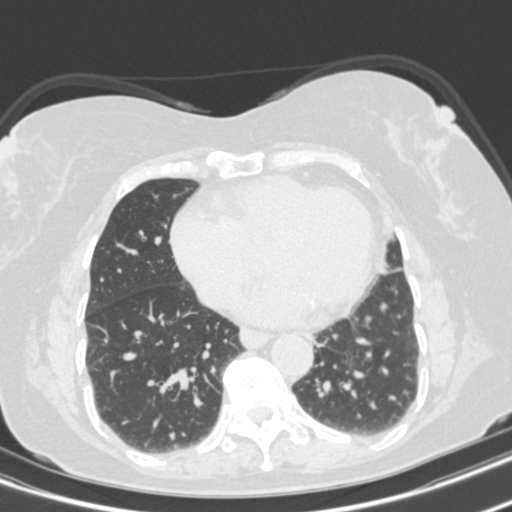
[im 130/352  lung]
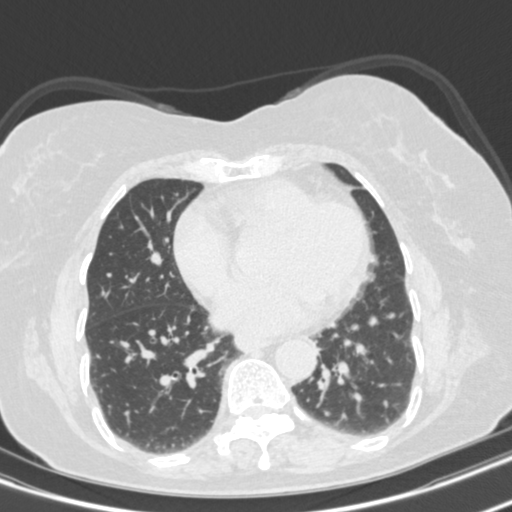
[im 165/352  lung]
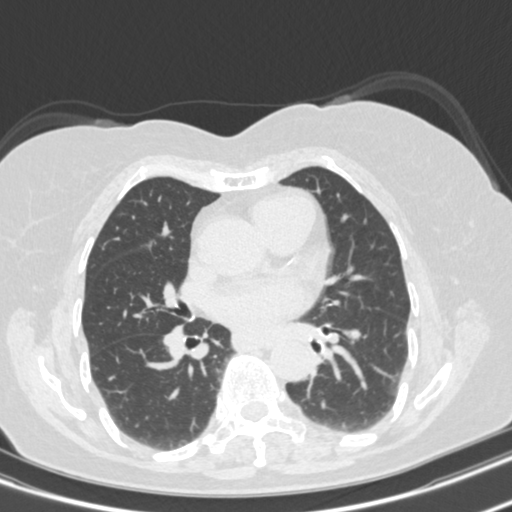
[im 167/352  lung]
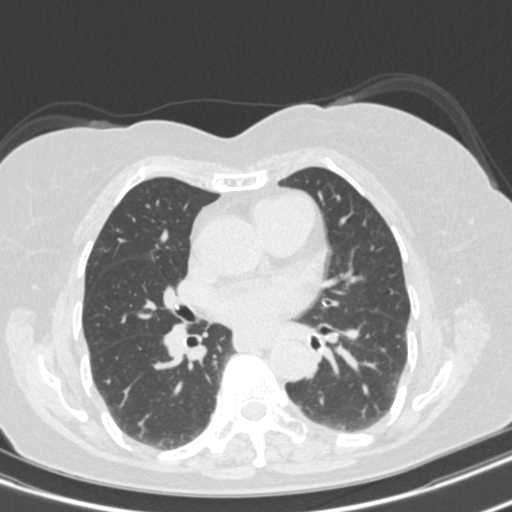
[im 185/352  mediastinal]
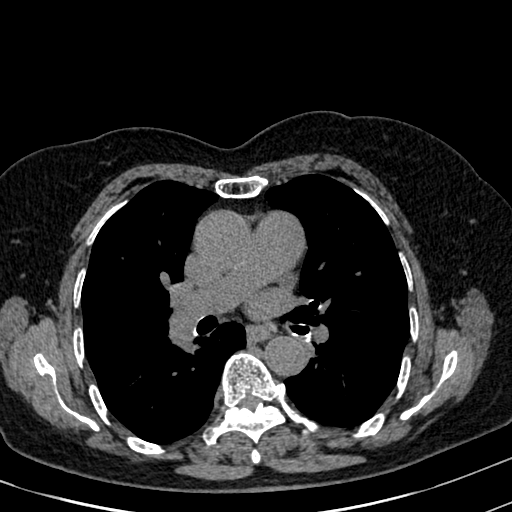
[im 185/352  lung]
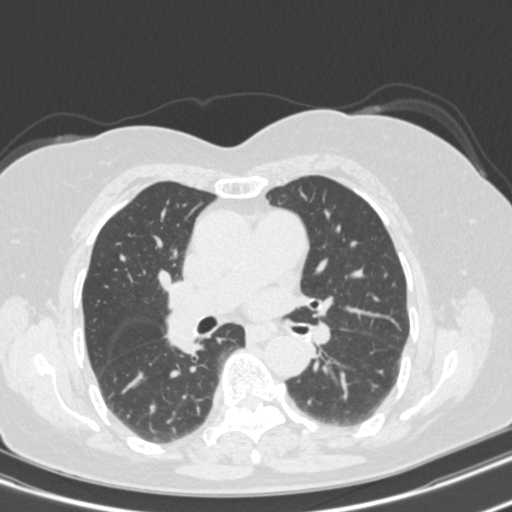
[im 222/352  lung]
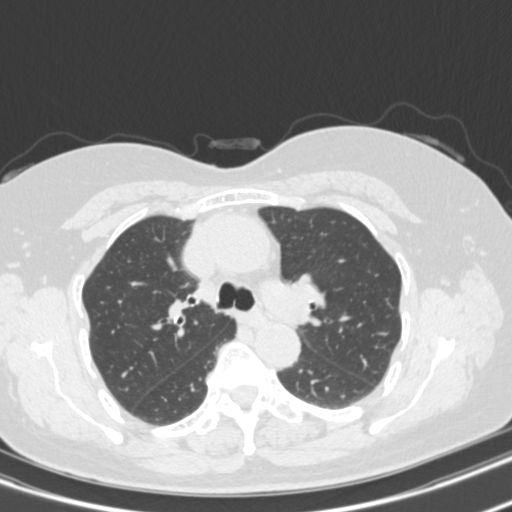
[im 235/352  lung]
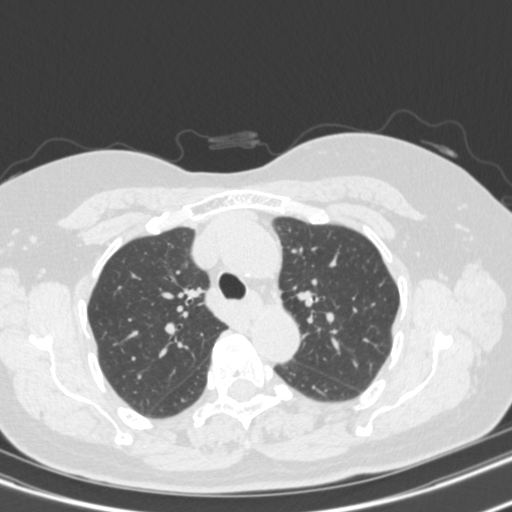
[im 259/352  lung]
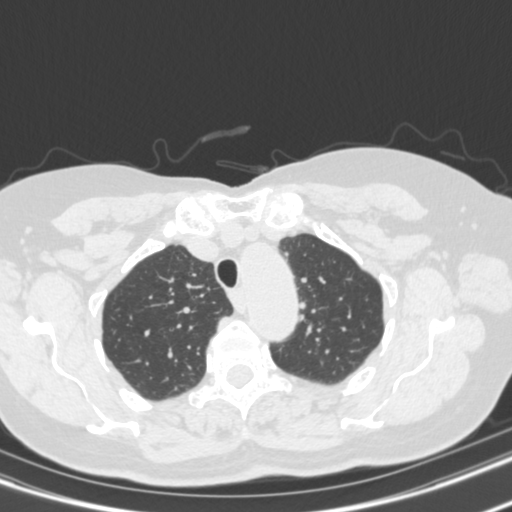
[im 278/352  mediastinal]
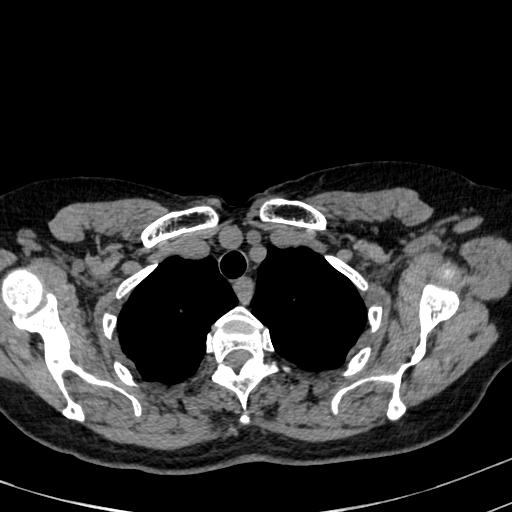
[im 278/352  lung]
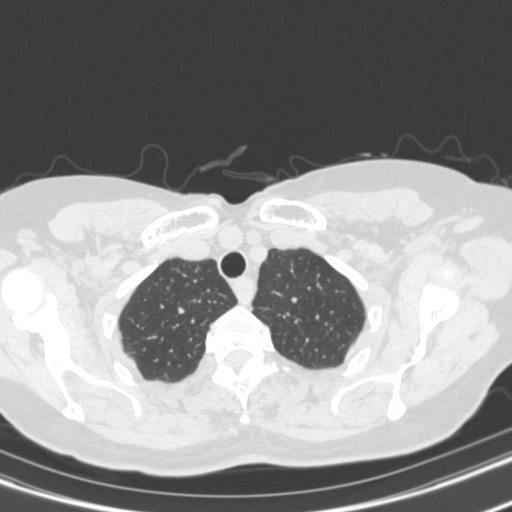
[im 296/352  lung]
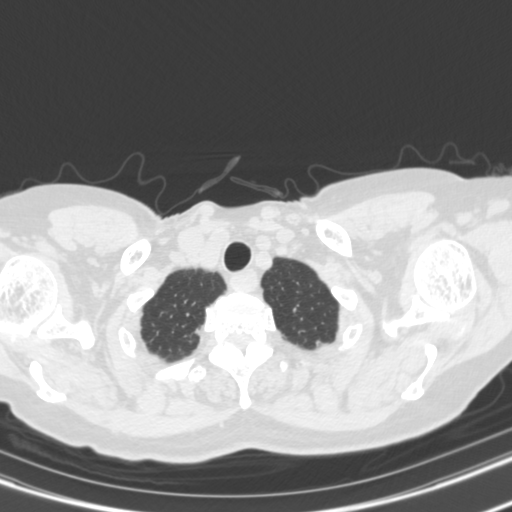
[im 333/352  lung]
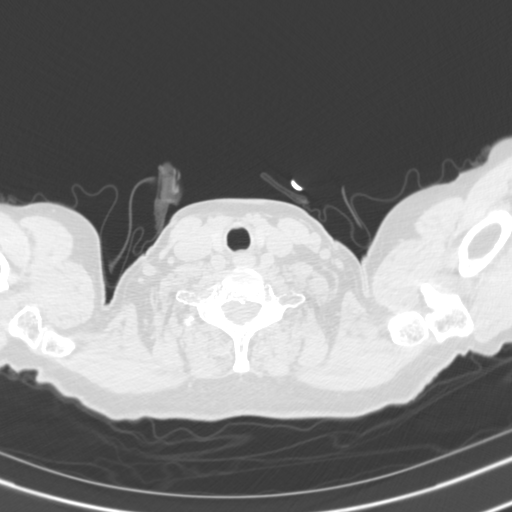

[15 of 32 positions shown; findings below may reference images not displayed]

FINDINGS: Evaluation of this exam is limited in the absence of intravenous
contrast.

Cardiovascular: There is no cardiomegaly or pericardial effusion.
There is coronary vascular calcification. The thoracic aorta and
central pulmonary arteries are grossly unremarkable on this
noncontrast CT.

Mediastinum/Nodes: No hilar or mediastinal adenopathy. The esophagus
is grossly unremarkable. No mediastinal fluid collection.

Lungs/Pleura: No focal consolidation, pleural effusion, or
pneumothorax. No discrete lung nodule. The central airways are
patent.

Upper Abdomen: No acute abnormality.

Musculoskeletal: Osteopenia with degenerative changes of the spine.
Multilevel disc desiccation and vacuum phenomena. No acute osseous
pathology.
IMPRESSION: 1. No acute intrathoracic pathology.  No pulmonary nodule.
2. Coronary vascular calcification.

## 2023-03-25 ENCOUNTER — Other Ambulatory Visit (HOSPITAL_COMMUNITY): Payer: Self-pay

## 2023-06-11 ENCOUNTER — Ambulatory Visit: Payer: BC Managed Care – PPO

## 2023-06-14 ENCOUNTER — Ambulatory Visit
Admission: RE | Admit: 2023-06-14 | Discharge: 2023-06-14 | Disposition: A | Discharge status: Home / Self Care | Payer: BC Managed Care – PPO | Source: Ambulatory Visit | Attending: Internal Medicine | Admitting: Internal Medicine

## 2023-06-14 DIAGNOSIS — Z1231 Encounter for screening mammogram for malignant neoplasm of breast: Secondary | ICD-10-CM

## 2024-03-23 ENCOUNTER — Other Ambulatory Visit: Payer: Self-pay | Admitting: Internal Medicine

## 2024-03-23 DIAGNOSIS — Z1231 Encounter for screening mammogram for malignant neoplasm of breast: Secondary | ICD-10-CM

## 2024-04-02 ENCOUNTER — Other Ambulatory Visit: Payer: Self-pay | Admitting: Pharmacist Clinician (PhC)/ Clinical Pharmacy Specialist

## 2024-04-02 MED ORDER — CEFADROXIL 500 MG PO CAPS
500.0000 mg | ORAL_CAPSULE | Freq: Two times a day (BID) | ORAL | 0 refills | Status: AC
Start: 2024-04-02 — End: ?

## 2024-06-23 ENCOUNTER — Ambulatory Visit
Admission: RE | Admit: 2024-06-23 | Discharge: 2024-06-23 | Disposition: A | Discharge status: Home / Self Care | Source: Ambulatory Visit | Attending: Internal Medicine | Admitting: Internal Medicine

## 2024-06-23 DIAGNOSIS — Z1231 Encounter for screening mammogram for malignant neoplasm of breast: Secondary | ICD-10-CM
